# Patient Record
Sex: Female | Born: 1955 | Race: White | Hispanic: No | Marital: Married | Smoking: Former smoker
Health system: Southern US, Community
[De-identification: ages and names within clinical notes are randomized; demographics above are authoritative.]

## PROBLEM LIST (undated history)

## (undated) DIAGNOSIS — F329 Major depressive disorder, single episode, unspecified: Secondary | ICD-10-CM

## (undated) DIAGNOSIS — F419 Anxiety disorder, unspecified: Secondary | ICD-10-CM

## (undated) DIAGNOSIS — N951 Menopausal and female climacteric states: Secondary | ICD-10-CM

## (undated) DIAGNOSIS — F32A Depression, unspecified: Secondary | ICD-10-CM

## (undated) DIAGNOSIS — M1712 Unilateral primary osteoarthritis, left knee: Secondary | ICD-10-CM

## (undated) DIAGNOSIS — G4730 Sleep apnea, unspecified: Secondary | ICD-10-CM

## (undated) DIAGNOSIS — N393 Stress incontinence (female) (male): Secondary | ICD-10-CM

## (undated) HISTORY — PX: MENISCUS REPAIR: SHX5179

## (undated) HISTORY — PX: APPENDECTOMY: SHX54

## (undated) HISTORY — PX: CARPAL TUNNEL RELEASE: SHX101

## (undated) SURGERY — Surgical Case
Anesthesia: *Unknown

---

## 1986-06-23 HISTORY — PX: DIAGNOSTIC LAPAROSCOPY: SUR761

## 1998-03-20 ENCOUNTER — Other Ambulatory Visit: Admission: RE | Admit: 1998-03-20 | Discharge: 1998-03-20 | Payer: Self-pay | Admitting: Obstetrics and Gynecology

## 1999-03-21 ENCOUNTER — Other Ambulatory Visit: Admission: RE | Admit: 1999-03-21 | Discharge: 1999-03-21 | Payer: Self-pay | Admitting: Obstetrics and Gynecology

## 1999-11-08 ENCOUNTER — Encounter (INDEPENDENT_AMBULATORY_CARE_PROVIDER_SITE_OTHER): Payer: Self-pay | Admitting: Specialist

## 1999-11-08 ENCOUNTER — Ambulatory Visit (HOSPITAL_BASED_OUTPATIENT_CLINIC_OR_DEPARTMENT_OTHER): Admission: RE | Admit: 1999-11-08 | Discharge: 1999-11-08 | Payer: Self-pay | Admitting: General Surgery

## 1999-11-08 HISTORY — PX: OTHER SURGICAL HISTORY: SHX169

## 2000-03-30 ENCOUNTER — Other Ambulatory Visit: Admission: RE | Admit: 2000-03-30 | Discharge: 2000-03-30 | Payer: Self-pay | Admitting: Obstetrics and Gynecology

## 2001-04-28 ENCOUNTER — Other Ambulatory Visit: Admission: RE | Admit: 2001-04-28 | Discharge: 2001-04-28 | Payer: Self-pay | Admitting: Obstetrics and Gynecology

## 2001-08-20 ENCOUNTER — Ambulatory Visit (HOSPITAL_COMMUNITY): Admission: RE | Admit: 2001-08-20 | Discharge: 2001-08-20 | Payer: Self-pay | Admitting: Gastroenterology

## 2002-10-18 ENCOUNTER — Other Ambulatory Visit: Admission: RE | Admit: 2002-10-18 | Discharge: 2002-10-18 | Payer: Self-pay | Admitting: Obstetrics and Gynecology

## 2003-11-15 ENCOUNTER — Other Ambulatory Visit: Admission: RE | Admit: 2003-11-15 | Discharge: 2003-11-15 | Payer: Self-pay | Admitting: Obstetrics and Gynecology

## 2003-11-22 ENCOUNTER — Encounter: Admission: RE | Admit: 2003-11-22 | Discharge: 2003-11-22 | Payer: Self-pay | Admitting: Obstetrics and Gynecology

## 2004-12-26 ENCOUNTER — Other Ambulatory Visit: Admission: RE | Admit: 2004-12-26 | Discharge: 2004-12-26 | Payer: Self-pay | Admitting: Obstetrics and Gynecology

## 2010-01-15 ENCOUNTER — Ambulatory Visit (HOSPITAL_BASED_OUTPATIENT_CLINIC_OR_DEPARTMENT_OTHER): Admission: RE | Admit: 2010-01-15 | Discharge: 2010-01-15 | Payer: Self-pay | Admitting: Orthopedic Surgery

## 2010-05-31 ENCOUNTER — Ambulatory Visit
Admission: RE | Admit: 2010-05-31 | Discharge: 2010-05-31 | Payer: Self-pay | Source: Home / Self Care | Attending: Orthopedic Surgery | Admitting: Orthopedic Surgery

## 2010-11-08 NOTE — Op Note (Signed)
[facility]  Patient:    Joanna Vazquez, Joanna Vazquez                      MRN: [id number] Proc. Date: 11/08/99 Adm. Date:  [id number] Disc. Date: [id number] Attending:  Glenna Fellows Tappan Dictator:   Lorne Skeens. Hoxworth, M.D.                           Operative Report  PREOPERATIVE DIAGNOSES: 1. Dysplastic nevus of the right mid back. 2. 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  POSTOPERATIVE DIAGNOSES: 1. Dysplastic nevus of the right mid back. 2. 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  PROCEDURES: 1. Excision of dysplastic nevus of the back. 2. Excision of atypical melanocytic neoplasm, right lower leg.  SURGEON:  Dr. Johna Sheriff.  ANESTHESIA:  Local with intravenous sedation.  HISTORY OF PRESENT ILLNESS:  Ms. Frentz is a 55 year old white female who recently underwent biopsy of two pigmented lesions, one on the right mid back and one on the right lower extremity.  Pathology on the back lesion has shown a dysplastic nevus with margins involved.  The lesion on the lower extremity has revealed an atypical melanocytic neoplasm with early stage in situ malignant melanoma not able to be ruled out.  Re-excision of both areas has been recommended with a microscopic negative margin on the back lesion, and a 4 mm margin on the lesion on the lower extremity.  The nature of the procedure has been discussed, including risks of bleeding and infection.  She is now brought to the operating room for these procedures.  DESCRIPTION OF PROCEDURE:  The patient is brought to the operating room, placed in the supine position on the operating table.  IV sedation was administered.  She was carefully positioned in the lateral position and the back was sterilely prepped and draped.  I did a transverse elliptical excision of the scar on the back with grossly normal margins with full thickness of skin.  Hemostasis was obtained with  cautery, and the incision was closed with running mattress suture of 4-0 Nylon.  Following this, the lateral right lower extremity was sterilely prepped and draped and the skin and soft tissue anesthetized.  A longitudinal elliptical excision was marked with 4 mm margins from the edge of the scar.  A full thickness elliptical skin was sharply excised.  A short skin and subcutaneous flaps were raised to allow closure under less tension.  Hemostasis was obtained with the cautery.  The wound was then closed with running mattress suture of 4-0 Nylon.  Sponge and needle counts were correct.  Dry sterile dressings were applied, and the patient was taken to recovery in good condition. DD:  11/08/99 TD:  11/13/99 Job: [id number]

## 2010-11-08 NOTE — Procedures (Signed)
[facility]  Patient:    Joanna Vazquez, Joanna Vazquez Visit Number: [id number] MRN: [id number]          Service Type: END Location: ENDO Attending Physician:  Louie Bun Dictated by:   Everardo All Madilyn Fireman, M.D. Proc. Date: 08/20/01 Admit Date:  08/20/2001   CC:         Juluis Mire, M.D.   Procedure Report  PROCEDURE:  Colonoscopy.  SURGEON:  John C. Madilyn Fireman, M.D.  INDICATIONS FOR PROCEDURE:  Family history of colon cancer in a first degree relative.  DESCRIPTION OF PROCEDURE:  The patient was placed in the left lateral decubitus position and placed on the pulse monitor with continuous low flow oxygen delivered by nasal cannula.  She was sedated with 70 mg of IV Demerol and 7 mg of IV Versed.  The Olympus video colonoscope was inserted into the rectum and advanced to the cecum, confirmed by transillumination at McBurneys point, and visualization of the ileocecal valve and appendiceal orifice.  The prep was excellent.  The cecum, ascending, transverse, descending, and sigmoid colon all appeared normal with no masses, polyps, diverticula, or other mucosal abnormalities.  The rectum likewise appeared normal on retroflexed view of the anus and revealed no obvious internal hemorrhoids.  The colonoscope was then withdrawn and the patient returned to the recovery room in stable condition.  She tolerated the procedure well and there were no immediate complications.  IMPRESSION:  Essentially normal colonoscopy.  PLAN:  Repeat study in five years based on her family history. Dictated by:   Everardo All Madilyn Fireman, M.D. Attending Physician:  Louie Bun DD:  08/20/01 TD:  08/20/01 Job: [id number]

## 2011-11-04 ENCOUNTER — Other Ambulatory Visit: Payer: Self-pay | Admitting: Obstetrics and Gynecology

## 2011-11-04 DIAGNOSIS — R928 Other abnormal and inconclusive findings on diagnostic imaging of breast: Secondary | ICD-10-CM

## 2011-11-11 ENCOUNTER — Ambulatory Visit
Admission: RE | Admit: 2011-11-11 | Discharge: 2011-11-11 | Disposition: A | Discharge status: Home / Self Care | Payer: BC Managed Care – PPO | Source: Ambulatory Visit | Attending: Obstetrics and Gynecology | Admitting: Obstetrics and Gynecology

## 2011-11-11 DIAGNOSIS — R928 Other abnormal and inconclusive findings on diagnostic imaging of breast: Secondary | ICD-10-CM

## 2013-01-07 ENCOUNTER — Encounter (HOSPITAL_BASED_OUTPATIENT_CLINIC_OR_DEPARTMENT_OTHER): Payer: Self-pay | Admitting: *Deleted

## 2013-01-11 ENCOUNTER — Encounter (HOSPITAL_BASED_OUTPATIENT_CLINIC_OR_DEPARTMENT_OTHER): Payer: Self-pay | Admitting: *Deleted

## 2013-01-11 NOTE — Progress Notes (Signed)
NPO AFTER MN. ARRIVES AT 0600. PRE-OP ORDERS PENDING. NEEDS HG.

## 2013-01-13 NOTE — H&P (Signed)
  Patient name  Joanna Vazquez, Joanna Vazquez DICTATION# [id number] CSN# [id number]  Juluis Mire, MD 01/13/2013 4:24 AM

## 2013-01-14 NOTE — H&P (Signed)
NAME:  Joanna Vazquez, Joanna Vazquez                  ACCOUNT NO.:  MEDICAL RECORD NO.:  LOCATION:                                 FACILITY:  PHYSICIAN:  Juluis Mire, M.D.        DATE OF BIRTH:  DATE OF ADMISSION: DATE OF DISCHARGE:                             HISTORY & PHYSICAL   Date of her surgery is January 17, 2013.  She is being done at [facility] outpatient area at [facility].  The patient is a 57 year old gravida 1, para 1 female who presents for management of stress incontinence with the mid urethral sling using a transobturator approach and cystoscopy.  In relation to present admission, the patient has been having trouble with worsening stress urinary incontinence.  She leaks when she coughs, sneezes, or does physical activity.  We did urodynamic testing in the office.  She had a normal postvoid residual.  She had normal leak point pressures.  Normal urethral pressure profile.  No evidence of uninhibited bladder contractions.  In view of this, she now presents for a mid urethral sling.  Other alternatives, such as physical therapy have been discussed.  ALLERGIES:  No known drug allergies.  MEDICATIONS:  Lexapro 20 mg a day.  PAST MEDICAL HISTORY:  Usual childhood diseases.  No significant sequelae.  PAST SURGICAL HISTORY:  She had a previous diagnostic laparoscopy, hysteroscopy, and laser ablation of the cervix.  This was done with the finding of endometriosis in for management of cervical dysplasia.  She has had one vaginal delivery.  FAMILY HISTORY:  There is a history of colon cancer, hypertension, and diabetes.  SOCIAL HISTORY:  Reveals occasional alcohol.  No tobacco use.  REVIEW OF SYSTEMS:  Noncontributory.  PHYSICAL EXAMINATION:  VITAL SIGNS:  The patient is afebrile, stable vital signs. HEENT:  The patient is normocephalic.  Pupils equal, round, and reactive to light and accommodation.  Extraocular movements were intact.  Sclerae and conjunctivae  are clear.  Oropharynx clear. NECK:  Without thyromegaly. BREASTS:  No discrete masses. LUNGS:  Clear. CARDIOVASCULAR SYSTEM:  Regular rhythm and rate.  No murmurs or gallops. ABDOMEN:  Benign.  No mass, organomegaly, or tenderness.  Pelvic is normal external genitalia, vaginal mucosa, mild cystourethrocele, cervix unremarkable.  Uterus normal size, shape, and contour.  Adnexa free of masses or tenderness. EXTREMITIES:  Trace edema. NEUROLOGIC:  Grossly within normal limits.  IMPRESSION:  Anatomical stress urinary incontinence for mid urethral sling.  PLAN:  Again, alternatives, such as physical therapy have been discussed.  Declined by patient.  She wishes to proceed with surgical management.  Success rates of 85% have been quoted.  The risks have been explained including risk of infection.  Risk of vascular injury that could lead to hemorrhage, requiring transfusion with risk of AIDS or hepatitis.  Risk of injury to adjacent organs such as bladder, urethral or ureters that could require further surgery.  Risk of deep venous thrombosis and pulmonary embolus.  Discussed the possibility of over tightening requiring loosening of the sling with possible return of incontinence.  We also discussed the development of bladder spasms, which require medical therapy.  Rate risk of obturator  nerve injury has been discussed, which could lead to chronic leg pain and weakness. Finally, there was a risk of mesh erosion or rejection that could lead to dyspareunia and possible need to remove the mesh.  The patient does understand potential risks, and alternatives.     Juluis Mire, M.D.     JSM/MEDQ  D:  01/13/2013  T:  01/13/2013  Job:  [id number]

## 2013-01-17 ENCOUNTER — Encounter (HOSPITAL_BASED_OUTPATIENT_CLINIC_OR_DEPARTMENT_OTHER): Payer: Self-pay | Admitting: *Deleted

## 2013-01-17 ENCOUNTER — Ambulatory Visit (HOSPITAL_COMMUNITY): Payer: BC Managed Care – PPO

## 2013-01-17 ENCOUNTER — Encounter (HOSPITAL_BASED_OUTPATIENT_CLINIC_OR_DEPARTMENT_OTHER): Payer: Self-pay | Admitting: Anesthesiology

## 2013-01-17 ENCOUNTER — Ambulatory Visit (HOSPITAL_BASED_OUTPATIENT_CLINIC_OR_DEPARTMENT_OTHER): Payer: BC Managed Care – PPO | Admitting: Anesthesiology

## 2013-01-17 ENCOUNTER — Encounter (HOSPITAL_BASED_OUTPATIENT_CLINIC_OR_DEPARTMENT_OTHER)
Admission: RE | Disposition: A | Discharge status: Home / Self Care | Payer: Self-pay | Source: Ambulatory Visit | Attending: Obstetrics and Gynecology

## 2013-01-17 ENCOUNTER — Ambulatory Visit (HOSPITAL_BASED_OUTPATIENT_CLINIC_OR_DEPARTMENT_OTHER)
Admission: RE | Admit: 2013-01-17 | Discharge: 2013-01-17 | Disposition: A | Discharge status: Home / Self Care | Payer: BC Managed Care – PPO | Source: Ambulatory Visit | Attending: Obstetrics and Gynecology | Admitting: Obstetrics and Gynecology

## 2013-01-17 DIAGNOSIS — T83712A Erosion of implanted urethral mesh to surrounding organ or tissue, initial encounter: Secondary | ICD-10-CM

## 2013-01-17 DIAGNOSIS — N393 Stress incontinence (female) (male): Secondary | ICD-10-CM | POA: Insufficient documentation

## 2013-01-17 HISTORY — PX: CYSTOSCOPY: SHX5120

## 2013-01-17 HISTORY — DX: Menopausal and female climacteric states: N95.1

## 2013-01-17 HISTORY — DX: Stress incontinence (female) (male): N39.3

## 2013-01-17 HISTORY — PX: PUBOVAGINAL SLING: SHX1035

## 2013-01-17 LAB — CBC
MCH: 29 pg (ref 26.0–34.0)
MCV: 87.2 fL (ref 78.0–100.0)
Platelets: 200 10*3/uL (ref 150–400)
RBC: 4.31 MIL/uL (ref 3.87–5.11)
RDW: 13 % (ref 11.5–15.5)
WBC: 7.9 10*3/uL (ref 4.0–10.5)

## 2013-01-17 SURGERY — CREATION, PUBOVAGINAL SLING
Anesthesia: General | Site: Vagina | Wound class: Clean Contaminated

## 2013-01-17 MED ORDER — INDIGOTINDISULFONATE SODIUM 8 MG/ML IJ SOLN
INTRAMUSCULAR | Status: DC | PRN
  Administered 2013-01-17: 5 mL via INTRAVENOUS

## 2013-01-17 MED ORDER — LACTATED RINGERS IV SOLN
INTRAVENOUS | Status: DC
  Administered 2013-01-17 (×2): via INTRAVENOUS
  Filled 2013-01-17: qty 1000

## 2013-01-17 MED ORDER — PROPOFOL 10 MG/ML IV BOLUS
INTRAVENOUS | Status: DC | PRN
  Administered 2013-01-17: 200 mg via INTRAVENOUS

## 2013-01-17 MED ORDER — SODIUM CHLORIDE 0.9 % IR SOLN
Status: DC | PRN
  Administered 2013-01-17: 08:00:00

## 2013-01-17 MED ORDER — ALBUTEROL SULFATE (5 MG/ML) 0.5% IN NEBU
2.5000 mg | INHALATION_SOLUTION | Freq: Four times a day (QID) | RESPIRATORY_TRACT | Status: DC | PRN
  Administered 2013-01-17 (×2): 2.5 mg via RESPIRATORY_TRACT
  Filled 2013-01-17: qty 0.5

## 2013-01-17 MED ORDER — PROMETHAZINE HCL 25 MG/ML IJ SOLN
6.2500 mg | INTRAMUSCULAR | Status: DC | PRN
  Filled 2013-01-17: qty 1

## 2013-01-17 MED ORDER — BUPIVACAINE-EPINEPHRINE PF 0.5-1:200000 % IJ SOLN
INTRAMUSCULAR | Status: DC | PRN
  Administered 2013-01-17: 18 mL

## 2013-01-17 MED ORDER — STERILE WATER FOR IRRIGATION IR SOLN
Status: DC | PRN
  Administered 2013-01-17: 1000 mL

## 2013-01-17 MED ORDER — ACETAMINOPHEN 10 MG/ML IV SOLN
INTRAVENOUS | Status: DC | PRN
  Administered 2013-01-17: 1000 mg via INTRAVENOUS

## 2013-01-17 MED ORDER — DEXAMETHASONE SODIUM PHOSPHATE 4 MG/ML IJ SOLN
INTRAMUSCULAR | Status: DC | PRN
  Administered 2013-01-17: 10 mg via INTRAVENOUS

## 2013-01-17 MED ORDER — LACTATED RINGERS IV SOLN
INTRAVENOUS | Status: DC
  Filled 2013-01-17: qty 1000

## 2013-01-17 MED ORDER — KETOROLAC TROMETHAMINE 30 MG/ML IJ SOLN
INTRAMUSCULAR | Status: DC | PRN
  Administered 2013-01-17: 30 mg via INTRAVENOUS

## 2013-01-17 MED ORDER — MIDAZOLAM HCL 5 MG/5ML IJ SOLN
INTRAMUSCULAR | Status: DC | PRN
  Administered 2013-01-17: 2 mg via INTRAVENOUS

## 2013-01-17 MED ORDER — LIDOCAINE HCL (CARDIAC) 20 MG/ML IV SOLN
INTRAVENOUS | Status: DC | PRN
  Administered 2013-01-17: 80 mg via INTRAVENOUS

## 2013-01-17 MED ORDER — FENTANYL CITRATE 0.05 MG/ML IJ SOLN
INTRAMUSCULAR | Status: DC | PRN
  Administered 2013-01-17: 25 ug via INTRAVENOUS
  Administered 2013-01-17 (×3): 50 ug via INTRAVENOUS
  Administered 2013-01-17: 25 ug via INTRAVENOUS

## 2013-01-17 MED ORDER — ONDANSETRON HCL 4 MG/2ML IJ SOLN
INTRAMUSCULAR | Status: DC | PRN
  Administered 2013-01-17: 4 mg via INTRAVENOUS

## 2013-01-17 MED ORDER — CEFAZOLIN SODIUM-DEXTROSE 2-3 GM-% IV SOLR
2.0000 g | INTRAVENOUS | Status: AC
  Administered 2013-01-17: 2 g via INTRAVENOUS
  Filled 2013-01-17: qty 50

## 2013-01-17 SURGICAL SUPPLY — 46 items
ADH SKN CLS APL DERMABOND .7 (GAUZE/BANDAGES/DRESSINGS)
BAG URINE DRAINAGE (UROLOGICAL SUPPLIES) ×3 IMPLANT
BLADE SURG 15 STRL LF DISP TIS (BLADE) ×2 IMPLANT
BLADE SURG 15 STRL SS (BLADE) ×3
CANISTER SUCTION 2500CC (MISCELLANEOUS) ×3 IMPLANT
CATH FOLEY 2WAY SLVR  5CC 14FR (CATHETERS) ×1
CATH FOLEY 2WAY SLVR  5CC 16FR (CATHETERS) ×1
CATH FOLEY 2WAY SLVR 5CC 14FR (CATHETERS) ×2 IMPLANT
CATH FOLEY 2WAY SLVR 5CC 16FR (CATHETERS) IMPLANT
CATH ROBINSON RED A/P 16FR (CATHETERS) ×1 IMPLANT
CLOTH BEACON ORANGE TIMEOUT ST (SAFETY) ×3 IMPLANT
DERMABOND ADVANCED (GAUZE/BANDAGES/DRESSINGS)
DERMABOND ADVANCED .7 DNX12 (GAUZE/BANDAGES/DRESSINGS) ×2 IMPLANT
DRAPE CAMERA CLOSED 9X96 (DRAPES) ×1 IMPLANT
DRAPE HYSTEROSCOPY (DRAPE) ×3 IMPLANT
DRAPE LG THREE QUARTER DISP (DRAPES) ×1 IMPLANT
ELECT REM PT RETURN 9FT ADLT (ELECTROSURGICAL) ×3
ELECTRODE REM PT RTRN 9FT ADLT (ELECTROSURGICAL) ×2 IMPLANT
GLOVE BIO SURGEON STRL SZ 6.5 (GLOVE) ×3 IMPLANT
GLOVE BIO SURGEON STRL SZ7 (GLOVE) ×4 IMPLANT
GLOVE INDICATOR 6.5 STRL GRN (GLOVE) ×1 IMPLANT
GOWN PREVENTION PLUS LG XLONG (DISPOSABLE) ×3 IMPLANT
GOWN STRL REIN XL XLG (GOWN DISPOSABLE) ×3 IMPLANT
HOLDER FOLEY CATH W/STRAP (MISCELLANEOUS) ×3 IMPLANT
NDL SPNL 22GX3.5 QUINCKE BK (NEEDLE) ×2 IMPLANT
NEEDLE HYPO 22GX1.5 SAFETY (NEEDLE) ×1 IMPLANT
NEEDLE SPNL 22GX3.5 QUINCKE BK (NEEDLE) IMPLANT
NS IRRIG 1000ML POUR BTL (IV SOLUTION) ×2 IMPLANT
NS IRRIG 500ML POUR BTL (IV SOLUTION) IMPLANT
PACK BASIN DAY SURGERY FS (CUSTOM PROCEDURE TRAY) ×3 IMPLANT
PACK CYSTOSCOPY (CUSTOM PROCEDURE TRAY) ×3 IMPLANT
PACKING VAGINAL (PACKING) ×1 IMPLANT
PENCIL BUTTON HOLSTER BLD 10FT (ELECTRODE) ×3 IMPLANT
PLUG CATH AND CAP STER (CATHETERS) ×3 IMPLANT
SET CYSTO W/LG BORE CLAMP LF (SET/KITS/TRAYS/PACK) ×3 IMPLANT
SET IRRIG Y TYPE TUR BLADDER L (SET/KITS/TRAYS/PACK) ×3 IMPLANT
SLING HALO OBTRYX (Sling) ×3 IMPLANT
SLING HALO OBTRYX NDL (Sling) IMPLANT
SUT VIC AB 2-0 UR6 27 (SUTURE) ×3 IMPLANT
SYR BULB IRRIGATION 50ML (SYRINGE) ×3 IMPLANT
SYRINGE 10CC LL (SYRINGE) ×3 IMPLANT
TOWEL OR 17X24 6PK STRL BLUE (TOWEL DISPOSABLE) ×6 IMPLANT
TRAY DSU PREP LF (CUSTOM PROCEDURE TRAY) ×3 IMPLANT
TUBE CONNECTING 12X1/4 (SUCTIONS) ×4 IMPLANT
WATER STERILE IRR 3000ML UROMA (IV SOLUTION) ×3 IMPLANT
YANKAUER SUCT BULB TIP NO VENT (SUCTIONS) ×3 IMPLANT

## 2013-01-17 NOTE — Brief Op Note (Signed)
01/17/2013  8:29 AM  PATIENT:  Joanna Vazquez  57 y.o. female  PRE-OPERATIVE DIAGNOSIS:  SUI cpt [phone]  POST-OPERATIVE DIAGNOSIS:  Stress Urinary Incontinence  PROCEDURE:  Procedure(s) with comments: PUBO-VAGINAL SLING (N/A) - transobturator sling with cysto CYSTOSCOPY (N/A)  SURGEON:  Surgeon(s) and Role:    * Juluis Mire, MD - Primary  PHYSICIAN ASSISTANT:   ASSISTANTS: none   ANESTHESIA:   local and general  EBL:  Total I/O In: 1000 [I.V.:1000] Out: -   BLOOD ADMINISTERED:none  DRAINS: Urinary Catheter (Foley)   LOCAL MEDICATIONS USED:  MARCAINE     SPECIMEN:  No Specimen  DISPOSITION OF SPECIMEN:  N/A  COUNTS:  YES  TOURNIQUET:  * No tourniquets in log *  DICTATION: .Other Dictation: Dictation Number [id number]  PLAN OF CARE: Discharge to home after PACU  PATIENT DISPOSITION:  PACU - hemodynamically stable.   Delay start of Pharmacological VTE agent (>24hrs) due to surgical blood loss or risk of bleeding: not applicable

## 2013-01-17 NOTE — Progress Notes (Signed)
Patient does not need urine preg

## 2013-01-17 NOTE — Op Note (Signed)
NAME:  Joanna Vazquez, Joanna Vazquez NO.:  [id number]  MEDICAL RECORD NO.:  [id number]  LOCATION:                                 FACILITY:  PHYSICIAN:  Juluis Mire, M.D.        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  PROCEDURE:  Mid-urethral sling using a transobturator approach. Cystoscopy.  SURGEON:  Juluis Mire, MD  ANESTHESIA:  General.  BLOOD LOSS:  100 mL.  PACKS:  None.  DRAINS:  Included urethral Foley.  INTRAOPERATIVE BLOOD PLACED:  None.  COMPLICATIONS:  None.  INDICATION:  Dictated in history and physical.  PROCEDURE IN DETAIL:  As follows; the patient was taken to the OR and placed in supine position.  After satisfactory level of anesthesia was obtained, the patient was placed in dorsal lithotomy position using the Allen stirrups.  Perineum and vagina were prepped out with Betadine, draped in sterile field.  A Foley was placed and clamped off and mid urethral area was identified, was then infiltrated with 0.5% Marcaine with epinephrine.  We also infiltrated out laterally to the obturator foramen.  Using a knife, an incision was made in the vaginal mucosa in the mid urethral area using blunt and sharp dissection.  We were able to dissect out laterally to both obturator foramen.  Point on the groin was identified at the level of clitoris lateral to the inferior pubic ramus, and below the abductus longus muscles.  This area was infiltrated and a punch incision was made.  The obturator system was brought in place. The needles were passed through the skin around the obturator through the obturator foramen around the inferior pubic ramus and out to the vaginal incision on both sides.   There was no evidence that we had buttonholed the vaginal mucosa.  Cystoscopy was then performed.  There was no evidence of injury to the bladder or urethra.  Both ureteral orifices were identified and they were spilling blue-tinged urine.  At this point in time, the cystoscope was removed.  Foley was placed back. It was again clamped off.  The polypropylene mesh was put in place, hooked to both needles and brought out through the skin.  The tab was cut.  It was adjusted in mid urethral area until lay flat, but we were relatively lucent, we could easily rotate a Kelly 90 degrees.  At this point in time, the plastic sheaths were removed.  Again, the mesh was adjusted so that it was at a proper tension.  The arms of the mesh coming out of the skin were cut.  Skin with Dermabond.  The vaginal mucosa closed in a running locked suture of 2-0 Vicryl.  Foley was placed to straight drain.  The patient taken out the dorsal lithotomy position.  Once alert and extubated, transferred to recovery room in good condition.  Sponge, instrument, and needle count was correct by circulating nurse x2.     Juluis Mire, M.D.  JSM/MEDQ  D:  01/17/2013  T:  01/17/2013  Job:  [id number]

## 2013-01-17 NOTE — Op Note (Signed)
Patient name Joanna Vazquez, Joanna Vazquez DICTATION#  [id number] CSN# [id number]   Juluis Mire, MD 01/17/2013 8:30 AM

## 2013-01-17 NOTE — Progress Notes (Signed)
Dr. Arelia Sneddon called and reported voiding 400 ml pink urine.

## 2013-01-17 NOTE — Transfer of Care (Signed)
Immediate Anesthesia Transfer of Care Note  Patient: Joanna Vazquez  Procedure(s) Performed: Procedure(s) (LRB): PUBO-VAGINAL SLING (N/A) CYSTOSCOPY (N/A)  Patient Location: PACU  Anesthesia Type: General  Level of Consciousness: drowsy, follows commands.  Airway & Oxygen Therapy: Patient Spontanous Breathing and Patient connected to face mask oxygen  Post-op Assessment: Report given to PACU RN and Post -op Vital signs reviewed and Oxygen Saturation in high 80's to low 90's. Crackles heard in bilateral lungs. Dr. Rica Mast aware. Breathing treatment and Chest x-ray ordered. Pt. Sitting up, coughing and deep breathing. Other VSS. Oxygen saturation up to 100% with breathing treatment.   Post vital signs: Reviewed and stable  Complications: No apparent anesthesia complications

## 2013-01-17 NOTE — Anesthesia Procedure Notes (Signed)
Procedure Name: LMA Insertion Date/Time: 01/17/2013 7:35 AM Performed by: Norva Pavlov Pre-anesthesia Checklist: Patient identified, Emergency Drugs available, Suction available and Patient being monitored Patient Re-evaluated:Patient Re-evaluated prior to inductionOxygen Delivery Method: Circle System Utilized Preoxygenation: Pre-oxygenation with 100% oxygen Intubation Type: IV induction Ventilation: Mask ventilation without difficulty LMA: LMA inserted LMA Size: 4.0 Number of attempts: 1 Airway Equipment and Method: bite block Placement Confirmation: positive ETCO2 Tube secured with: Tape Dental Injury: Teeth and Oropharynx as per pre-operative assessment

## 2013-01-17 NOTE — H&P (Signed)
  History and physical exam unchanged 

## 2013-01-17 NOTE — Anesthesia Preprocedure Evaluation (Signed)
Anesthesia Evaluation  Patient identified by MRN, date of birth, ID band Patient awake    Reviewed: Allergy & Precautions, H&P , NPO status , Patient's Chart, lab work & pertinent test results  Airway Mallampati: II TM Distance: >3 FB Neck ROM: Full    Dental  (+) Teeth Intact and Dental Advisory Given   Pulmonary neg pulmonary ROS,    Pulmonary exam normal       Cardiovascular negative cardio ROS  Rhythm:Regular Rate:Normal     Neuro/Psych negative neurological ROS  negative psych ROS   GI/Hepatic negative GI ROS, Neg liver ROS,   Endo/Other  negative endocrine ROS  Renal/GU negative Renal ROS  negative genitourinary   Musculoskeletal negative musculoskeletal ROS (+)   Abdominal   Peds negative pediatric ROS (+)  Hematology negative hematology ROS (+)   Anesthesia Other Findings   Reproductive/Obstetrics negative OB ROS Patient denies risk of pregnancy.                           Anesthesia Physical Anesthesia Plan  ASA: I  Anesthesia Plan: General   Post-op Pain Management:    Induction: Intravenous  Airway Management Planned: LMA  Additional Equipment:   Intra-op Plan:   Post-operative Plan: Extubation in OR  Informed Consent: I have reviewed the patients History and Physical, chart, labs and discussed the procedure including the risks, benefits and alternatives for the proposed anesthesia with the patient or authorized representative who has indicated his/her understanding and acceptance.   Dental advisory given  Plan Discussed with: CRNA  Anesthesia Plan Comments:         Anesthesia Quick Evaluation

## 2013-01-17 NOTE — Anesthesia Postprocedure Evaluation (Signed)
  Anesthesia Post-op Note  Patient: Joanna Vazquez  Procedure(s) Performed: Procedure(s) with comments: PUBO-VAGINAL SLING (N/A) - transobturator sling with cysto CYSTOSCOPY (N/A)  Patient Location: PACU  Anesthesia Type:General  Level of Consciousness: awake, alert  and oriented  Airway and Oxygen Therapy: Patient Spontanous Breathing and Patient connected to nasal cannula oxygen  Post-op Pain: mild  Post-op Assessment: Post-op Vital signs reviewed  Post-op Vital Signs: stable  Complications: respiratory complications

## 2013-01-18 ENCOUNTER — Encounter (HOSPITAL_BASED_OUTPATIENT_CLINIC_OR_DEPARTMENT_OTHER): Payer: Self-pay | Admitting: Obstetrics and Gynecology

## 2013-01-28 ENCOUNTER — Ambulatory Visit (INDEPENDENT_AMBULATORY_CARE_PROVIDER_SITE_OTHER): Payer: BC Managed Care – PPO | Admitting: General Surgery

## 2013-01-28 ENCOUNTER — Encounter (INDEPENDENT_AMBULATORY_CARE_PROVIDER_SITE_OTHER): Payer: Self-pay | Admitting: General Surgery

## 2013-01-28 VITALS — BP 110/62 | HR 74 | Temp 98.2°F | Resp 18 | Ht 65.0 in | Wt 212.0 lb

## 2013-01-28 DIAGNOSIS — K644 Residual hemorrhoidal skin tags: Secondary | ICD-10-CM

## 2013-01-28 NOTE — Progress Notes (Signed)
Patient ID: Joanna Vazquez, female   DOB: Aug 22, 1955, 57 y.o.   MRN: [id number]  Chief Complaint  Patient presents with  . Other    Eval painful bleeding hems    HPI Joanna Vazquez is a 57 y.o. female.  The patient is a 57 year old female who is referred by Dr. Arelia Sneddon  For an evaluation ofexternal hemorrhoids. Patient issues that would be for approximately 22 years from her last birth.  The patient she has on-off painful bleeding as well as pain with bleeding. She states she's saturating several of her clothing items unknowingly.  Of note the patient has a family history of colon cancer as been undergoing screening colonoscopy since age 52. They have all been normal. HPI  Past Medical History  Diagnosis Date  . SUI (stress urinary incontinence, female)   . Perimenopause     Past Surgical History  Procedure Laterality Date  . Carpal tunnel release Bilateral LEFT  01-15-2010/   RIGHT 05-31-2010  . Excision nevus right mid back/ excision melanocystic neoplasm right lower leg  11-08-1999  . Diagnostic laparoscopy  1988  . Pubovaginal sling N/A 01/17/2013    Procedure: Leonides Grills;  Surgeon: Juluis Mire, MD;  Location: [facility];  Service: Gynecology;  Laterality: N/A;  transobturator sling with cysto  . Cystoscopy N/A 01/17/2013    Procedure: CYSTOSCOPY;  Surgeon: Juluis Mire, MD;  Location: [facility];  Service: Gynecology;  Laterality: N/A;    History reviewed. No pertinent family history.  Social History History  Substance Use Topics  . Smoking status: Former Games developer  . Smokeless tobacco: Former Neurosurgeon    Quit date: 01/29/1991  . Alcohol Use: 1.0 oz/week    2 drink(s) per week    No Known Allergies  Current Outpatient Prescriptions  Medication Sig Dispense Refill  . escitalopram (LEXAPRO) 20 MG tablet Take 20 mg by mouth daily.      . fexofenadine (ALLEGRA) 180 MG tablet Take 180 mg by mouth daily.       No current  facility-administered medications for this visit.    Review of Systems Review of Systems  Constitutional: Negative.   HENT: Negative.   Respiratory: Negative.   Cardiovascular: Negative.   Gastrointestinal: Positive for anal bleeding.  Endocrine: Negative.   Musculoskeletal: Negative.   Neurological: Negative.   All other systems reviewed and are negative.    Blood pressure 110/62, pulse 74, temperature 98.2 F (36.8 C), resp. rate 18, height 5\' 5"  (1.651 m), weight 212 lb (96.163 kg), last menstrual period 12/12/2012.  Physical Exam Physical Exam  Constitutional: She is oriented to person, place, and time. She appears well-developed and well-nourished.  HENT:  Head: Normocephalic and atraumatic.  Eyes: Conjunctivae and EOM are normal. Pupils are equal, round, and reactive to light.  Neck: Normal range of motion. Neck supple.  Cardiovascular: Normal rate, regular rhythm and normal heart sounds.   Pulmonary/Chest: Effort normal and breath sounds normal.  Abdominal: Soft.  Genitourinary: Rectal exam shows external hemorrhoid and internal hemorrhoid.  Neurological: She is alert and oriented to person, place, and time.  Skin: Skin is dry.    Data Reviewed none  Assessment    57 year old female with symptomatic external/internal hemorrhoids.     Plan    1. We'll proceed to the operating room for exam under anesthesia as well as excision of external and possible banding of internal hemorrhoids. 2. I discussed with the patient the risks and benefits of surgery  to include infection, bleeding, healing complications, recurrence of hemorrhoids, and damage to surrounding tissue. The patient voiced understanding and wishes to proceed.        Marigene Ehlers., Londynn Sonoda 01/28/2013, 10:38 AM

## 2013-03-07 ENCOUNTER — Other Ambulatory Visit (INDEPENDENT_AMBULATORY_CARE_PROVIDER_SITE_OTHER): Payer: Self-pay | Admitting: General Surgery

## 2013-03-07 ENCOUNTER — Other Ambulatory Visit (INDEPENDENT_AMBULATORY_CARE_PROVIDER_SITE_OTHER): Payer: Self-pay | Admitting: *Deleted

## 2013-03-07 DIAGNOSIS — K648 Other hemorrhoids: Secondary | ICD-10-CM

## 2013-03-07 DIAGNOSIS — K644 Residual hemorrhoidal skin tags: Secondary | ICD-10-CM

## 2013-03-07 HISTORY — PX: ANAL EXAMINATION UNDER ANESTHESIA: SHX1138

## 2013-03-07 MED ORDER — OXYCODONE-ACETAMINOPHEN 5-325 MG PO TABS: 1.0000 | ORAL_TABLET | ORAL | Status: DC | PRN

## 2013-03-10 ENCOUNTER — Telehealth (INDEPENDENT_AMBULATORY_CARE_PROVIDER_SITE_OTHER): Payer: Self-pay

## 2013-03-10 NOTE — Telephone Encounter (Signed)
Pt calling in to report that she was having issues of urinating this pm after having her first BM since hemorrhoid surgery on Monday. The pt has not had any issues with urinating until today where it felt like she needed to urinate but it was just a slow flow. I advised pt that she needed to try sitting in a warm sitz bath and urinate to see if the warm water will relax the muscles. I advised pt that if she is still having problems in the a.m. She needs to call us first thing so we can take care of this before the weekend. The pt understands.

## 2013-03-14 ENCOUNTER — Telehealth (INDEPENDENT_AMBULATORY_CARE_PROVIDER_SITE_OTHER): Payer: Self-pay

## 2013-03-14 DIAGNOSIS — R39198 Other difficulties with micturition: Secondary | ICD-10-CM

## 2013-03-14 NOTE — Telephone Encounter (Signed)
Patient called in stating she is still having problems with urinating and trouble sleeping. Wants to know if she can take Ambien with her percocet. After speaking to Dr Derrell Lolling i told her she can take Ambien with her percocet at night. He wants to get an UA on her. Advised her to go to [facility] to for the UA. We will call her with results.

## 2013-03-15 LAB — URINALYSIS
Glucose, UA: NEGATIVE mg/dL
Leukocytes, UA: NEGATIVE
Protein, ur: NEGATIVE mg/dL
Specific Gravity, Urine: 1.009 (ref 1.005–1.030)
Urobilinogen, UA: 0.2 mg/dL (ref 0.0–1.0)

## 2013-03-16 ENCOUNTER — Telehealth (INDEPENDENT_AMBULATORY_CARE_PROVIDER_SITE_OTHER): Payer: Self-pay | Admitting: *Deleted

## 2013-03-16 NOTE — Telephone Encounter (Signed)
Called and left message for patient with below message.  Instructed patient to give [place] a call if she has any questions or concerns.

## 2013-03-16 NOTE — Telephone Encounter (Signed)
Message copied by Consuelo Pandy on Wed Mar 16, 2013  9:23 AM ------      Message from: Axel Filler      Created: Tue Mar 15, 2013  3:36 PM       Can you please call pt and let her know that her UA was neg and that she does not have a UTI.            Thank you            AR      ----- Message -----         From: [facility]         Sent: 03/15/2013   1:09 AM           To: Axel Filler, MD                   ------

## 2013-03-29 ENCOUNTER — Encounter (INDEPENDENT_AMBULATORY_CARE_PROVIDER_SITE_OTHER): Payer: Self-pay | Admitting: General Surgery

## 2013-03-29 ENCOUNTER — Ambulatory Visit (INDEPENDENT_AMBULATORY_CARE_PROVIDER_SITE_OTHER): Payer: BC Managed Care – PPO | Admitting: General Surgery

## 2013-03-29 VITALS — BP 116/84 | HR 85 | Temp 96.9°F | Ht 65.0 in | Wt 210.4 lb

## 2013-03-29 DIAGNOSIS — Z9889 Other specified postprocedural states: Secondary | ICD-10-CM

## 2013-03-29 NOTE — Progress Notes (Signed)
Patient ID: Joanna Vazquez, female   DOB: [birth year], 56 y.o.   MRN: [id number] The patient is a 57 year old female status post hemorrhoidectomy on 03/07/2013. The patient is doing well at this time. Her initial complaint of incomplete emptying he has since resolved. Patient is on a bowel regimen of Colace twice a day. The patient has minimal pain with bowel movements at this time.    Assessment and plan: 21 -year-old female now status post hemorrhoidectomy 1. Patient continue with bowel regimen at this time. 2. Patient follow as needed

## 2014-03-07 ENCOUNTER — Other Ambulatory Visit: Payer: Self-pay | Admitting: Obstetrics and Gynecology

## 2014-03-08 LAB — CYTOLOGY - PAP

## 2015-03-27 ENCOUNTER — Other Ambulatory Visit: Payer: Self-pay | Admitting: Obstetrics and Gynecology

## 2015-03-28 LAB — CYTOLOGY - PAP

## 2015-03-30 ENCOUNTER — Other Ambulatory Visit: Payer: Self-pay | Admitting: Obstetrics and Gynecology

## 2015-03-30 DIAGNOSIS — R928 Other abnormal and inconclusive findings on diagnostic imaging of breast: Secondary | ICD-10-CM

## 2015-04-09 ENCOUNTER — Ambulatory Visit
Admission: RE | Admit: 2015-04-09 | Discharge: 2015-04-09 | Disposition: A | Discharge status: Home / Self Care | Payer: BLUE CROSS/BLUE SHIELD | Source: Ambulatory Visit | Attending: Obstetrics and Gynecology | Admitting: Obstetrics and Gynecology

## 2015-04-09 DIAGNOSIS — R928 Other abnormal and inconclusive findings on diagnostic imaging of breast: Secondary | ICD-10-CM

## 2015-10-12 ENCOUNTER — Other Ambulatory Visit: Payer: Self-pay | Admitting: Obstetrics and Gynecology

## 2015-10-12 DIAGNOSIS — R921 Mammographic calcification found on diagnostic imaging of breast: Secondary | ICD-10-CM

## 2015-10-18 ENCOUNTER — Other Ambulatory Visit: Payer: Self-pay | Admitting: Obstetrics and Gynecology

## 2015-10-18 ENCOUNTER — Ambulatory Visit
Admission: RE | Admit: 2015-10-18 | Discharge: 2015-10-18 | Disposition: A | Discharge status: Home / Self Care | Payer: BLUE CROSS/BLUE SHIELD | Source: Ambulatory Visit | Attending: Obstetrics and Gynecology | Admitting: Obstetrics and Gynecology

## 2015-10-18 DIAGNOSIS — R921 Mammographic calcification found on diagnostic imaging of breast: Secondary | ICD-10-CM

## 2015-10-29 ENCOUNTER — Other Ambulatory Visit: Payer: BLUE CROSS/BLUE SHIELD

## 2016-04-01 ENCOUNTER — Other Ambulatory Visit: Payer: Self-pay | Admitting: Obstetrics and Gynecology

## 2016-04-01 DIAGNOSIS — R921 Mammographic calcification found on diagnostic imaging of breast: Secondary | ICD-10-CM

## 2016-04-07 ENCOUNTER — Ambulatory Visit
Admission: RE | Admit: 2016-04-07 | Discharge: 2016-04-07 | Disposition: A | Discharge status: Home / Self Care | Payer: BLUE CROSS/BLUE SHIELD | Source: Ambulatory Visit | Attending: Obstetrics and Gynecology | Admitting: Obstetrics and Gynecology

## 2016-04-07 DIAGNOSIS — R921 Mammographic calcification found on diagnostic imaging of breast: Secondary | ICD-10-CM

## 2016-04-15 ENCOUNTER — Other Ambulatory Visit: Payer: Self-pay | Admitting: Obstetrics and Gynecology

## 2016-04-15 DIAGNOSIS — Z803 Family history of malignant neoplasm of breast: Secondary | ICD-10-CM

## 2016-04-23 ENCOUNTER — Encounter (HOSPITAL_COMMUNITY): Payer: Self-pay

## 2016-04-23 ENCOUNTER — Encounter: Payer: Self-pay | Admitting: Physician Assistant

## 2016-04-23 DIAGNOSIS — F329 Major depressive disorder, single episode, unspecified: Secondary | ICD-10-CM | POA: Diagnosis present

## 2016-04-23 DIAGNOSIS — M1712 Unilateral primary osteoarthritis, left knee: Secondary | ICD-10-CM

## 2016-04-23 DIAGNOSIS — G4730 Sleep apnea, unspecified: Secondary | ICD-10-CM | POA: Diagnosis present

## 2016-04-23 DIAGNOSIS — F419 Anxiety disorder, unspecified: Secondary | ICD-10-CM

## 2016-04-23 HISTORY — DX: Unilateral primary osteoarthritis, left knee: M17.12

## 2016-04-23 NOTE — H&P (Signed)
TOTAL KNEE ADMISSION H&P  Patient is being admitted for left total knee arthroplasty.  Subjective:  Chief Complaint:left knee pain.  HPI: Joanna Vazquez, 60 y.o. female, has a history of pain and functional disability in the left knee due to arthritis and has failed non-surgical conservative treatments for greater than 12 weeks to includeNSAID's and/or analgesics, corticosteriod injections, viscosupplementation injections, flexibility and strengthening excercises, supervised PT with diminished ADL's post treatment, weight reduction as appropriate and activity modification.  Onset of symptoms was gradual, starting 8 years ago with gradually worsening course since that time. The patient noted prior procedures on the knee to include  arthroscopy and menisectomy on the left knee(s).  Patient currently rates pain in the left knee(s) at 10 out of 10 with activity. Patient has night pain, worsening of pain with activity and weight bearing, pain that interferes with activities of daily living, crepitus and joint swelling.  Patient has evidence of subchondral sclerosis, periarticular osteophytes and joint space narrowing by imaging studies.There is no active infection.  Patient Active Problem List   Diagnosis Date Noted  . Primary localized osteoarthritis of left knee 04/23/2016  . Sleep apnea   . Anxiety and depression   . External hemorrhoids 01/28/2013  . Urinary, incontinence, stress female 01/17/2013    Class: Present on Admission  . Erosion of transobdurator mid-urethral sling (HCC) 01/17/2013   Past Medical History:  Diagnosis Date  . Anxiety and depression   . Perimenopause   . Primary localized osteoarthritis of left knee 04/23/2016  . Sleep apnea   . SUI (stress urinary incontinence, female)     Past Surgical History:  Procedure Laterality Date  . ANAL EXAMINATION UNDER ANESTHESIA  03/07/13   banding of internal and external hems  . CARPAL TUNNEL RELEASE Bilateral LEFT  01-15-2010/    RIGHT 05-31-2010  . CYSTOSCOPY N/A 01/17/2013   Procedure: CYSTOSCOPY;  Surgeon: Juluis MireJohn S McComb, MD;  [facility] [place];  Service: Gynecology;  Laterality: N/A;  . DIAGNOSTIC LAPAROSCOPY  1988  . 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 PUBOVAGINAL SLING N/A 01/17/2013   Procedure: Leonides GrillsPUBO-VAGINAL SLING;  Surgeon: Juluis MireJohn S McComb, MD;  [facility] ;  Service: Gynecology;  Laterality: N/A;  transobturator sling with cysto  No current facility-administered medications for this encounter.   Current Outpatient Prescriptions:  .  ALPRAZolam (XANAX) 0.25 MG tablet, Take 0.25 mg by mouth 3 (three) times daily as needed for anxiety., Disp: , Rfl:  .  meloxicam (MOBIC) 15 MG tablet, Take 15 mg by mouth daily., Disp: , Rfl:  .  escitalopram (LEXAPRO) 20 MG tablet, Take 20 mg by mouth daily., Disp: , Rfl:  .  fexofenadine (ALLEGRA) 180 MG tablet, Take 180 mg by mouth daily., Disp: , Rfl:  .  zolpidem (AMBIEN) 5 MG tablet, Take 5 mg by mouth at bedtime as needed for sleep., Disp: , Rfl:     No Known Allergies    Social History  Substance Use Topics  . Smoking status: Former Games developermoker  . Smokeless tobacco: Former NeurosurgeonUser    Quit date: 01/29/1991  . Alcohol use 1.0 oz/week    2 drink(s) per week    Family History  Problem Relation Age of Onset  . Diabetes Father   . Alzheimer's disease Father   . Colon cancer Father   . Breast cancer Sister      Review of Systems  Constitutional: Negative.   HENT:  Negative.   Eyes: Negative.   Respiratory: Negative.   Cardiovascular: Negative.   Gastrointestinal: Negative.   Genitourinary: Negative.   Musculoskeletal: Positive for back pain and joint pain.  Skin: Negative.   Neurological: Negative.   Endo/Heme/Allergies: Negative.   Psychiatric/Behavioral: Negative.     Objective:  Physical Exam  Constitutional: She is oriented to person, place, and time. She  appears well-developed and well-nourished.  HENT:  Head: Normocephalic and atraumatic.  Mouth/Throat: Oropharynx is clear and moist.  Eyes: Conjunctivae are normal. Pupils are equal, round, and reactive to light.  Neck: Neck supple.  Cardiovascular: Normal rate and regular rhythm.   Respiratory: Breath sounds normal.  GI: Bowel sounds are normal.  Genitourinary:  Genitourinary Comments: Not pertinent to current symptomatology therefore not examined.  Musculoskeletal:  Examination of her bilateral knees reveal pain bilaterally.  2-3+ crepitation.  1+ synovitis.  Range of motion 0-120 degrees.  Knees are stable with normal patella tracking.  Vascular exam: Pulses are 2+ and symmetric.    Neurological: She is alert and oriented to person, place, and time.  Skin: Skin is warm and dry.  Psychiatric: She has a normal mood and affect. Her behavior is normal.    Vital signs in last 24 hours:    Labs:   Estimated body mass index is 35.01 kg/m as calculated from the following:   Height as of 03/29/13: 5\' 5"  (1.651 m).   Weight as of 03/29/13: 95.4 kg (210 lb 6.4 oz).   Imaging Review Plain radiographs demonstrate severe degenerative joint disease of the left knee(s). The overall alignment issignificant varus. The bone quality appears to be good for age and reported activity level.  Assessment/Plan:  End stage arthritis, left knee  Principal Problem:   Primary localized osteoarthritis of left knee Active Problems:   Sleep apnea   Anxiety and depression   The patient history, physical examination, clinical judgment of the provider and imaging studies are consistent with end stage degenerative joint disease of the left knee(s) and total knee arthroplasty is deemed medically necessary. The treatment options including medical management, injection therapy arthroscopy and arthroplasty were discussed at length. The risks and benefits of total knee arthroplasty were presented and reviewed. The  risks due to aseptic loosening, infection, stiffness, patella tracking problems, thromboembolic complications and other imponderables were discussed. The patient acknowledged the explanation, agreed to proceed with the plan and consent was signed. Patient is being admitted for inpatient treatment for surgery, pain control, PT, OT, prophylactic antibiotics, VTE prophylaxis, progressive ambulation and ADL's and discharge planning. The patient is planning to be discharged home with home health services

## 2016-04-23 NOTE — Pre-Procedure Instructions (Signed)
Carolyn StareCeleste L Comins  04/23/2016      [id number]ato Battles- [place], [place] - [address] [place] [id number] Phone: [phone] Fax: [phone]    Your procedure is scheduled on Monday November 13.  [facility]:45 [facility].M.  Call this number if you have problems the morning of surgery:  [phone]   Remember:  Do not eat food or drink liquids after midnight.  Take these medicines the morning of surgery with A SIP OF WATER: escitalopram (Lexapro), Allegra  7 days prior to surgery STOP taking any Aspirin, Aleve, Naproxen, Ibuprofen, Motrin, Advil, Goody's, BC's, all herbal medications, fish oil, and all vitamins    Do not wear jewelry, make-up or nail polish.  Do not wear lotions, powders, or perfumes, or deoderant.  Do not shave 48 hours prior to surgery.  Men may shave face and neck.  Do not bring valuables to the hospital.  [facility] is not responsible for any belongings or valuables.  Contacts, dentures or bridgework may not be worn into surgery.  Leave your suitcase in the car.  After surgery it may be brought to your room.  For patients admitted to the hospital, discharge time will be determined by your treatment team.  Patients discharged the day of surgery will not be allowed to drive home.   Special instructions:     [place]- Preparing For Surgery  Before surgery, you can play an important role. Because skin is not sterile, your skin needs to be as free of germs as possible. You can reduce the number of germs on your skin by washing with CHG (chlorahexidine gluconate) Soap before surgery.  CHG is an antiseptic cleaner which kills germs and bonds with the skin to continue killing germs even after washing.  Please do not use if you have an allergy to CHG or antibacterial soaps. If your skin becomes reddened/irritated stop using the CHG.  Do not shave (including legs and underarms) for at least 48 hours prior to  first CHG shower. It is OK to shave your face.  Please follow these instructions carefully.   1. Shower the NIGHT BEFORE SURGERY and the MORNING OF SURGERY with CHG.   2. If you chose to wash your hair, wash your hair first as usual with your normal shampoo.  3. After you shampoo, rinse your hair and body thoroughly to remove the shampoo.  4. Use CHG as you would any other liquid soap. You can apply CHG directly to the skin and wash gently with a scrungie or a clean washcloth.   5. Apply the CHG Soap to your body ONLY FROM THE NECK DOWN.  Do not use on open wounds or open sores. Avoid contact with your eyes, ears, mouth and genitals (private parts). Wash genitals (private parts) with your normal soap.  6. Wash thoroughly, paying special attention to the area where your surgery will be performed.  7. Thoroughly rinse your body with warm water from the neck down.  8. DO NOT shower/wash with your normal soap after using and rinsing off the CHG Soap.  9. Pat yourself dry with a CLEAN TOWEL.   10. Wear CLEAN PAJAMAS   11. Place CLEAN SHEETS on your bed the night of your first shower and DO NOT SLEEP WITH PETS.    Day of Surgery: Do not apply any deodorants/lotions. Please wear clean clothes to the hospital/surgery center.      Please read  over the following fact sheets that you were given. MRSA Information

## 2016-04-24 ENCOUNTER — Encounter (HOSPITAL_COMMUNITY)
Admission: RE | Admit: 2016-04-24 | Discharge: 2016-04-24 | Disposition: A | Discharge status: Home / Self Care | Payer: BLUE CROSS/BLUE SHIELD | Source: Ambulatory Visit | Attending: Orthopedic Surgery | Admitting: Orthopedic Surgery

## 2016-04-24 ENCOUNTER — Encounter (HOSPITAL_COMMUNITY): Payer: Self-pay | Admitting: *Deleted

## 2016-04-24 DIAGNOSIS — M1712 Unilateral primary osteoarthritis, left knee: Secondary | ICD-10-CM | POA: Insufficient documentation

## 2016-04-24 DIAGNOSIS — Z01818 Encounter for other preprocedural examination: Secondary | ICD-10-CM | POA: Diagnosis present

## 2016-04-24 HISTORY — DX: Anxiety disorder, unspecified: F41.9

## 2016-04-24 LAB — COMPREHENSIVE METABOLIC PANEL
ALT: 23 U/L (ref 14–54)
AST: 21 U/L (ref 15–41)
Albumin: 4.1 g/dL (ref 3.5–5.0)
Alkaline Phosphatase: 32 U/L — ABNORMAL LOW (ref 38–126)
Anion gap: 8 (ref 5–15)
BILIRUBIN TOTAL: 0.5 mg/dL (ref 0.3–1.2)
BUN: 13 mg/dL (ref 6–20)
CALCIUM: 9.4 mg/dL (ref 8.9–10.3)
CO2: 23 mmol/L (ref 22–32)
CREATININE: 0.74 mg/dL (ref 0.44–1.00)
Chloride: 107 mmol/L (ref 101–111)
GFR calc Af Amer: >60 mL/min (ref >60)
Glucose, Bld: 112 mg/dL — ABNORMAL HIGH (ref 65–99)
POTASSIUM: 4 mmol/L (ref 3.5–5.1)
Sodium: 138 mmol/L (ref 135–145)
TOTAL PROTEIN: 7 g/dL (ref 6.5–8.1)

## 2016-04-24 LAB — CBC WITH DIFFERENTIAL/PLATELET
BASOS ABS: 0.1 10*3/uL (ref 0.0–0.1)
Basophils Relative: 2 %
Eosinophils Absolute: 0.2 10*3/uL (ref 0.0–0.7)
Eosinophils Relative: 3 %
HEMATOCRIT: 41.6 % (ref 36.0–46.0)
Hemoglobin: 13.5 g/dL (ref 12.0–15.0)
LYMPHS PCT: 31 %
Lymphs Abs: 1.9 10*3/uL (ref 0.7–4.0)
MCH: 29 pg (ref 26.0–34.0)
MCHC: 32.5 g/dL (ref 30.0–36.0)
MCV: 89.5 fL (ref 78.0–100.0)
MONO ABS: 0.5 10*3/uL (ref 0.1–1.0)
Monocytes Relative: 8 %
NEUTROS ABS: 3.5 10*3/uL (ref 1.7–7.7)
Neutrophils Relative %: 56 %
Platelets: 180 10*3/uL (ref 150–400)
RBC: 4.65 MIL/uL (ref 3.87–5.11)
RDW: 13.3 % (ref 11.5–15.5)
WBC: 6.2 10*3/uL (ref 4.0–10.5)

## 2016-04-24 LAB — ABO/RH: ABO/RH(D): A NEG

## 2016-04-24 LAB — TYPE AND SCREEN
ABO/RH(D): A NEG
Antibody Screen: NEGATIVE

## 2016-04-24 LAB — APTT: APTT: 29 s (ref 24–36)

## 2016-04-24 LAB — PROTIME-INR
INR: 1
PROTHROMBIN TIME: 13.2 s (ref 11.4–15.2)

## 2016-04-24 LAB — SURGICAL PCR SCREEN
MRSA, PCR: NEGATIVE
STAPHYLOCOCCUS AUREUS: NEGATIVE

## 2016-04-24 LAB — HCG, SERUM, QUALITATIVE: PREG SERUM: NEGATIVE

## 2016-04-24 NOTE — Progress Notes (Signed)
[facility].

## 2016-04-25 ENCOUNTER — Ambulatory Visit: Payer: BLUE CROSS/BLUE SHIELD | Admitting: Family

## 2016-04-25 LAB — URINE CULTURE: Culture: <10000 — AB

## 2016-04-28 ENCOUNTER — Ambulatory Visit
Admission: RE | Admit: 2016-04-28 | Discharge: 2016-04-28 | Disposition: A | Discharge status: Home / Self Care | Payer: BLUE CROSS/BLUE SHIELD | Source: Ambulatory Visit | Attending: Obstetrics and Gynecology | Admitting: Obstetrics and Gynecology

## 2016-04-28 DIAGNOSIS — Z803 Family history of malignant neoplasm of breast: Secondary | ICD-10-CM

## 2016-04-28 MED ORDER — GADOBENATE DIMEGLUMINE 529 MG/ML IV SOLN
20.0000 mL | Freq: Once | INTRAVENOUS | Status: AC | PRN
  Administered 2016-04-28: 20 mL via INTRAVENOUS

## 2016-05-05 ENCOUNTER — Encounter (HOSPITAL_COMMUNITY): Payer: Self-pay | Admitting: *Deleted

## 2016-05-05 ENCOUNTER — Inpatient Hospital Stay (HOSPITAL_COMMUNITY): Payer: BLUE CROSS/BLUE SHIELD | Admitting: Anesthesiology

## 2016-05-05 ENCOUNTER — Inpatient Hospital Stay (HOSPITAL_COMMUNITY)
Admission: RE | Admit: 2016-05-05 | Discharge: 2016-05-07 | DRG: 470 | Disposition: A | Discharge status: Home Health | Payer: BLUE CROSS/BLUE SHIELD | Source: Ambulatory Visit | Attending: Orthopedic Surgery | Admitting: Orthopedic Surgery

## 2016-05-05 ENCOUNTER — Encounter (HOSPITAL_COMMUNITY)
Admission: RE | Disposition: A | Discharge status: Home Health | Payer: Self-pay | Source: Ambulatory Visit | Attending: Orthopedic Surgery

## 2016-05-05 DIAGNOSIS — F32A Depression, unspecified: Secondary | ICD-10-CM | POA: Diagnosis present

## 2016-05-05 DIAGNOSIS — Z87891 Personal history of nicotine dependence: Secondary | ICD-10-CM | POA: Diagnosis not present

## 2016-05-05 DIAGNOSIS — F419 Anxiety disorder, unspecified: Secondary | ICD-10-CM | POA: Diagnosis present

## 2016-05-05 DIAGNOSIS — G4730 Sleep apnea, unspecified: Secondary | ICD-10-CM | POA: Diagnosis present

## 2016-05-05 DIAGNOSIS — Z79899 Other long term (current) drug therapy: Secondary | ICD-10-CM

## 2016-05-05 DIAGNOSIS — M1712 Unilateral primary osteoarthritis, left knee: Secondary | ICD-10-CM | POA: Diagnosis present

## 2016-05-05 DIAGNOSIS — F329 Major depressive disorder, single episode, unspecified: Secondary | ICD-10-CM | POA: Diagnosis present

## 2016-05-05 HISTORY — DX: Major depressive disorder, single episode, unspecified: F32.9

## 2016-05-05 HISTORY — DX: Depression, unspecified: F41.9

## 2016-05-05 HISTORY — DX: Unilateral primary osteoarthritis, left knee: M17.12

## 2016-05-05 HISTORY — DX: Depression, unspecified: F32.A

## 2016-05-05 HISTORY — DX: Sleep apnea, unspecified: G47.30

## 2016-05-05 HISTORY — PX: TOTAL KNEE ARTHROPLASTY: SHX125

## 2016-05-05 LAB — CBC
HCT: 41.8 % (ref 36.0–46.0)
Hemoglobin: 13.7 g/dL (ref 12.0–15.0)
MCH: 29.4 pg (ref 26.0–34.0)
MCHC: 32.8 g/dL (ref 30.0–36.0)
MCV: 89.7 fL (ref 78.0–100.0)
PLATELETS: 181 10*3/uL (ref 150–400)
RBC: 4.66 MIL/uL (ref 3.87–5.11)
RDW: 13 % (ref 11.5–15.5)
WBC: 9.3 10*3/uL (ref 4.0–10.5)

## 2016-05-05 LAB — CREATININE, SERUM
CREATININE: 0.87 mg/dL (ref 0.44–1.00)
GFR calc Af Amer: >60 mL/min (ref >60)
GFR calc non Af Amer: >60 mL/min (ref >60)

## 2016-05-05 SURGERY — ARTHROPLASTY, KNEE, TOTAL
Anesthesia: Spinal | Laterality: Left

## 2016-05-05 MED ORDER — MIDAZOLAM HCL 5 MG/5ML IJ SOLN
INTRAMUSCULAR | Status: DC | PRN
  Administered 2016-05-05: 1 mg via INTRAVENOUS

## 2016-05-05 MED ORDER — DOCUSATE SODIUM 100 MG PO CAPS
100.0000 mg | ORAL_CAPSULE | Freq: Two times a day (BID) | ORAL | Status: DC
  Administered 2016-05-05 – 2016-05-07 (×4): 100 mg via ORAL
  Filled 2016-05-05 (×4): qty 1

## 2016-05-05 MED ORDER — BUPIVACAINE HCL (PF) 0.25 % IJ SOLN
INTRAMUSCULAR | Status: AC
  Filled 2016-05-05: qty 30

## 2016-05-05 MED ORDER — HYDROMORPHONE HCL 2 MG/ML IJ SOLN
1.0000 mg | INTRAMUSCULAR | Status: DC | PRN
  Administered 2016-05-05 – 2016-05-06 (×4): 1 mg via INTRAVENOUS
  Filled 2016-05-05 (×4): qty 1

## 2016-05-05 MED ORDER — MIDAZOLAM HCL 2 MG/2ML IJ SOLN
2.0000 mg | Freq: Once | INTRAMUSCULAR | Status: AC
  Administered 2016-05-05: 2 mg via INTRAVENOUS

## 2016-05-05 MED ORDER — LIDOCAINE HCL (CARDIAC) 20 MG/ML IV SOLN
INTRAVENOUS | Status: DC | PRN
  Administered 2016-05-05: 60 mg via INTRAVENOUS

## 2016-05-05 MED ORDER — LIDOCAINE 2% (20 MG/ML) 5 ML SYRINGE
INTRAMUSCULAR | Status: AC
  Filled 2016-05-05: qty 5

## 2016-05-05 MED ORDER — PHENOL 1.4 % MT LIQD: 1.0000 | OROMUCOSAL | Status: DC | PRN

## 2016-05-05 MED ORDER — DEXAMETHASONE SODIUM PHOSPHATE 10 MG/ML IJ SOLN
INTRAMUSCULAR | Status: AC
  Filled 2016-05-05: qty 1

## 2016-05-05 MED ORDER — HYDROMORPHONE HCL 1 MG/ML IJ SOLN
0.2500 mg | INTRAMUSCULAR | Status: DC | PRN
  Administered 2016-05-05: 0.5 mg via INTRAVENOUS

## 2016-05-05 MED ORDER — POVIDONE-IODINE 7.5 % EX SOLN
Freq: Once | CUTANEOUS | Status: DC
Start: 2016-05-05 — End: 2016-05-05

## 2016-05-05 MED ORDER — DEXTROSE 5 % IV SOLN
3.0000 g | INTRAVENOUS | Status: AC
  Administered 2016-05-05: 3 g via INTRAVENOUS
  Filled 2016-05-05: qty 3000

## 2016-05-05 MED ORDER — CELECOXIB 200 MG PO CAPS
200.0000 mg | ORAL_CAPSULE | Freq: Two times a day (BID) | ORAL | Status: DC
  Administered 2016-05-05 – 2016-05-07 (×4): 200 mg via ORAL
  Filled 2016-05-05 (×4): qty 1

## 2016-05-05 MED ORDER — ESCITALOPRAM OXALATE 10 MG PO TABS
20.0000 mg | ORAL_TABLET | Freq: Every day | ORAL | Status: DC
  Administered 2016-05-06 – 2016-05-07 (×2): 20 mg via ORAL
  Filled 2016-05-05 (×2): qty 2

## 2016-05-05 MED ORDER — ZOLPIDEM TARTRATE 5 MG PO TABS
5.0000 mg | ORAL_TABLET | Freq: Every evening | ORAL | Status: DC | PRN
  Administered 2016-05-06: 5 mg via ORAL
  Filled 2016-05-05: qty 1

## 2016-05-05 MED ORDER — MENTHOL 3 MG MT LOZG: 1.0000 | LOZENGE | OROMUCOSAL | Status: DC | PRN

## 2016-05-05 MED ORDER — POLYETHYLENE GLYCOL 3350 17 G PO PACK
17.0000 g | PACK | Freq: Two times a day (BID) | ORAL | Status: DC
  Administered 2016-05-05 – 2016-05-07 (×4): 17 g via ORAL
  Filled 2016-05-05 (×4): qty 1

## 2016-05-05 MED ORDER — PROMETHAZINE HCL 25 MG/ML IJ SOLN: 6.2500 mg | INTRAMUSCULAR | Status: DC | PRN

## 2016-05-05 MED ORDER — OXYCODONE HCL 5 MG PO TABS
5.0000 mg | ORAL_TABLET | ORAL | Status: DC | PRN
  Administered 2016-05-05: 10 mg via ORAL
  Filled 2016-05-05 (×2): qty 2

## 2016-05-05 MED ORDER — ONDANSETRON HCL 4 MG/2ML IJ SOLN
INTRAMUSCULAR | Status: AC
  Filled 2016-05-05: qty 2

## 2016-05-05 MED ORDER — METOCLOPRAMIDE HCL 5 MG PO TABS: 5.0000 mg | ORAL_TABLET | Freq: Three times a day (TID) | ORAL | Status: DC | PRN

## 2016-05-05 MED ORDER — MIDAZOLAM HCL 2 MG/2ML IJ SOLN
INTRAMUSCULAR | Status: AC
  Filled 2016-05-05: qty 2

## 2016-05-05 MED ORDER — MIDAZOLAM HCL 2 MG/2ML IJ SOLN
INTRAMUSCULAR | Status: AC
  Administered 2016-05-05: 2 mg via INTRAVENOUS
  Filled 2016-05-05: qty 2

## 2016-05-05 MED ORDER — FENTANYL CITRATE (PF) 100 MCG/2ML IJ SOLN
INTRAMUSCULAR | Status: AC
  Filled 2016-05-05: qty 2

## 2016-05-05 MED ORDER — HYDROMORPHONE HCL 1 MG/ML IJ SOLN
INTRAMUSCULAR | Status: AC
  Filled 2016-05-05: qty 0.5

## 2016-05-05 MED ORDER — OXYCODONE HCL ER 10 MG PO T12A
10.0000 mg | EXTENDED_RELEASE_TABLET | Freq: Two times a day (BID) | ORAL | Status: DC
  Administered 2016-05-05: 10 mg via ORAL
  Filled 2016-05-05: qty 1

## 2016-05-05 MED ORDER — BUPIVACAINE IN DEXTROSE 0.75-8.25 % IT SOLN
INTRATHECAL | Status: DC | PRN
  Administered 2016-05-05: 2 mL via INTRATHECAL

## 2016-05-05 MED ORDER — SODIUM CHLORIDE 0.9 % IR SOLN
Status: DC | PRN
  Administered 2016-05-05: 1000 mL
  Administered 2016-05-05: 3000 mL

## 2016-05-05 MED ORDER — ONDANSETRON HCL 4 MG PO TABS: 4.0000 mg | ORAL_TABLET | Freq: Four times a day (QID) | ORAL | Status: DC | PRN

## 2016-05-05 MED ORDER — ACETAMINOPHEN 325 MG PO TABS: 650.0000 mg | ORAL_TABLET | Freq: Four times a day (QID) | ORAL | Status: DC | PRN

## 2016-05-05 MED ORDER — CHLORHEXIDINE GLUCONATE 4 % EX LIQD: 60.0000 mL | Freq: Once | CUTANEOUS | Status: DC

## 2016-05-05 MED ORDER — PROPOFOL 500 MG/50ML IV EMUL
INTRAVENOUS | Status: DC | PRN
  Administered 2016-05-05: 25 ug/kg/min via INTRAVENOUS

## 2016-05-05 MED ORDER — FENTANYL CITRATE (PF) 100 MCG/2ML IJ SOLN
50.0000 ug | Freq: Once | INTRAMUSCULAR | Status: AC
  Administered 2016-05-05: 50 ug via INTRAVENOUS

## 2016-05-05 MED ORDER — FENTANYL CITRATE (PF) 100 MCG/2ML IJ SOLN
INTRAMUSCULAR | Status: AC
  Administered 2016-05-05: 50 ug via INTRAVENOUS
  Filled 2016-05-05: qty 2

## 2016-05-05 MED ORDER — CHLORHEXIDINE GLUCONATE 4 % EX LIQD
60.0000 mL | Freq: Once | CUTANEOUS | Status: DC
Start: 2016-05-05 — End: 2016-05-05

## 2016-05-05 MED ORDER — FENTANYL CITRATE (PF) 100 MCG/2ML IJ SOLN
INTRAMUSCULAR | Status: DC | PRN
  Administered 2016-05-05: 50 ug via INTRAVENOUS

## 2016-05-05 MED ORDER — BUPIVACAINE-EPINEPHRINE 0.25% -1:200000 IJ SOLN
INTRAMUSCULAR | Status: DC | PRN
Start: 2016-05-05 — End: 2016-05-05
  Administered 2016-05-05: 30 mL

## 2016-05-05 MED ORDER — DIPHENHYDRAMINE HCL 12.5 MG/5ML PO ELIX
12.5000 mg | ORAL_SOLUTION | ORAL | Status: DC | PRN
  Administered 2016-05-05: 25 mg via ORAL
  Filled 2016-05-05: qty 10

## 2016-05-05 MED ORDER — POTASSIUM CHLORIDE IN NACL 20-0.9 MEQ/L-% IV SOLN
INTRAVENOUS | Status: DC
  Administered 2016-05-05: 19:00:00 via INTRAVENOUS
  Filled 2016-05-05 (×2): qty 1000

## 2016-05-05 MED ORDER — LACTATED RINGERS IV SOLN
INTRAVENOUS | Status: DC
  Administered 2016-05-05 (×2): via INTRAVENOUS
  Administered 2016-05-05: 20 mL/h via INTRAVENOUS

## 2016-05-05 MED ORDER — ALUM & MAG HYDROXIDE-SIMETH 200-200-20 MG/5ML PO SUSP: 30.0000 mL | ORAL | Status: DC | PRN

## 2016-05-05 MED ORDER — TRANEXAMIC ACID 1000 MG/10ML IV SOLN
1000.0000 mg | INTRAVENOUS | Status: DC
  Filled 2016-05-05: qty 10

## 2016-05-05 MED ORDER — WHITE PETROLATUM GEL
Status: AC
  Filled 2016-05-05: qty 1

## 2016-05-05 MED ORDER — ONDANSETRON HCL 4 MG/2ML IJ SOLN: 4.0000 mg | Freq: Four times a day (QID) | INTRAMUSCULAR | Status: DC | PRN

## 2016-05-05 MED ORDER — VITAMIN C 500 MG PO TABS
250.0000 mg | ORAL_TABLET | Freq: Every day | ORAL | Status: DC
  Administered 2016-05-05 – 2016-05-07 (×3): 250 mg via ORAL
  Filled 2016-05-05 (×3): qty 1

## 2016-05-05 MED ORDER — METOCLOPRAMIDE HCL 5 MG/ML IJ SOLN: 5.0000 mg | Freq: Three times a day (TID) | INTRAMUSCULAR | Status: DC | PRN

## 2016-05-05 MED ORDER — ACETAMINOPHEN 650 MG RE SUPP: 650.0000 mg | Freq: Four times a day (QID) | RECTAL | Status: DC | PRN

## 2016-05-05 MED ORDER — PHENYLEPHRINE HCL 10 MG/ML IJ SOLN
INTRAVENOUS | Status: DC | PRN
  Administered 2016-05-05: 20 ug/min via INTRAVENOUS

## 2016-05-05 MED ORDER — ENOXAPARIN SODIUM 30 MG/0.3ML ~~LOC~~ SOLN
30.0000 mg | Freq: Two times a day (BID) | SUBCUTANEOUS | Status: DC
  Administered 2016-05-06 – 2016-05-07 (×3): 30 mg via SUBCUTANEOUS
  Filled 2016-05-05 (×3): qty 0.3

## 2016-05-05 MED ORDER — CEFAZOLIN SODIUM-DEXTROSE 2-4 GM/100ML-% IV SOLN
2.0000 g | Freq: Three times a day (TID) | INTRAVENOUS | Status: AC
  Administered 2016-05-05 – 2016-05-06 (×2): 2 g via INTRAVENOUS
  Filled 2016-05-05 (×2): qty 100

## 2016-05-05 MED ORDER — DEXAMETHASONE SODIUM PHOSPHATE 10 MG/ML IJ SOLN
INTRAMUSCULAR | Status: DC | PRN
  Administered 2016-05-05: 10 mg via INTRAVENOUS

## 2016-05-05 MED ORDER — ONDANSETRON HCL 4 MG/2ML IJ SOLN
INTRAMUSCULAR | Status: DC | PRN
  Administered 2016-05-05: 4 mg via INTRAVENOUS

## 2016-05-05 MED ORDER — DEXAMETHASONE SODIUM PHOSPHATE 10 MG/ML IJ SOLN
10.0000 mg | Freq: Three times a day (TID) | INTRAMUSCULAR | Status: AC
  Administered 2016-05-05 – 2016-05-06 (×4): 10 mg via INTRAVENOUS
  Filled 2016-05-05 (×4): qty 1

## 2016-05-05 MED ORDER — LORATADINE 10 MG PO TABS
10.0000 mg | ORAL_TABLET | Freq: Every day | ORAL | Status: DC
  Administered 2016-05-06 – 2016-05-07 (×2): 10 mg via ORAL
  Filled 2016-05-05 (×2): qty 1

## 2016-05-05 MED ORDER — VITAMIN D 1000 UNITS PO TABS
1000.0000 [IU] | ORAL_TABLET | Freq: Every day | ORAL | Status: DC
  Administered 2016-05-05 – 2016-05-07 (×3): 1000 [IU] via ORAL
  Filled 2016-05-05 (×3): qty 1

## 2016-05-05 MED ORDER — TRANEXAMIC ACID 1000 MG/10ML IV SOLN
INTRAVENOUS | Status: DC | PRN
  Administered 2016-05-05: 1000 mg via INTRAVENOUS

## 2016-05-05 MED ORDER — CLONAZEPAM 1 MG PO TABS
1.0000 mg | ORAL_TABLET | Freq: Every day | ORAL | Status: DC
  Administered 2016-05-05: 1 mg via ORAL
  Filled 2016-05-05: qty 1

## 2016-05-05 SURGICAL SUPPLY — 76 items
APL SKNCLS STERI-STRIP NONHPOA (GAUZE/BANDAGES/DRESSINGS) ×1
BANDAGE ELASTIC 3 VELCRO ST LF (GAUZE/BANDAGES/DRESSINGS) ×2 IMPLANT
BANDAGE ESMARK 6X9 LF (GAUZE/BANDAGES/DRESSINGS) ×1 IMPLANT
BENZOIN TINCTURE PRP APPL 2/3 (GAUZE/BANDAGES/DRESSINGS) ×3 IMPLANT
BLADE SAGITTAL 25.0X1.19X90 (BLADE) ×3 IMPLANT
BLADE SAGITTAL 25.0X1.19X90MM (BLADE) ×2
BLADE SAW SGTL 13X75X1.27 (BLADE) ×3 IMPLANT
BLADE SURG 10 STRL SS (BLADE) ×6 IMPLANT
BNDG CMPR 9X6 STRL LF SNTH (GAUZE/BANDAGES/DRESSINGS) ×1
BNDG CMPR MED 15X6 ELC VLCR LF (GAUZE/BANDAGES/DRESSINGS) ×1
BNDG ELASTIC 6X15 VLCR STRL LF (GAUZE/BANDAGES/DRESSINGS) ×3 IMPLANT
BNDG ESMARK 6X9 LF (GAUZE/BANDAGES/DRESSINGS) ×3
BOWL SMART MIX CTS (DISPOSABLE) ×3 IMPLANT
CAPT KNEE TOTAL 3 ATTUNE ×2 IMPLANT
CEMENT HV SMART SET (Cement) ×6 IMPLANT
CLOSURE WOUND 1/2 X4 (GAUZE/BANDAGES/DRESSINGS) ×1
COVER SURGICAL LIGHT HANDLE (MISCELLANEOUS) ×3 IMPLANT
CUFF TOURNIQUET SINGLE 34IN LL (TOURNIQUET CUFF) ×3 IMPLANT
CUFF TOURNIQUET SINGLE 44IN (TOURNIQUET CUFF) IMPLANT
DECANTER SPIKE VIAL GLASS SM (MISCELLANEOUS) ×3 IMPLANT
DRAPE EXTREMITY T 121X128X90 (DRAPE) ×3 IMPLANT
DRAPE HALF SHEET 40X57 (DRAPES) ×3 IMPLANT
DRAPE INCISE IOBAN 66X45 STRL (DRAPES) ×3 IMPLANT
DRAPE PROXIMA HALF (DRAPES) ×3 IMPLANT
DRAPE U-SHAPE 47X51 STRL (DRAPES) ×3 IMPLANT
DRSG AQUACEL AG ADV 3.5X14 (GAUZE/BANDAGES/DRESSINGS) ×3 IMPLANT
DURAPREP 26ML APPLICATOR (WOUND CARE) ×6 IMPLANT
ELECT CAUTERY BLADE 6.4 (BLADE) ×3 IMPLANT
ELECT REM PT RETURN 9FT ADLT (ELECTROSURGICAL) ×3
ELECTRODE REM PT RTRN 9FT ADLT (ELECTROSURGICAL) ×1 IMPLANT
FACESHIELD WRAPAROUND (MASK) ×3 IMPLANT
FACESHIELD WRAPAROUND OR TEAM (MASK) ×1 IMPLANT
GLOVE BIO SURGEON STRL SZ7 (GLOVE) ×7 IMPLANT
GLOVE BIOGEL PI IND STRL 7.0 (GLOVE) ×1 IMPLANT
GLOVE BIOGEL PI IND STRL 7.5 (GLOVE) ×1 IMPLANT
GLOVE BIOGEL PI INDICATOR 7.0 (GLOVE) ×6
GLOVE BIOGEL PI INDICATOR 7.5 (GLOVE) ×2
GLOVE EUDERMIC 7 POWDERFREE (GLOVE) ×6 IMPLANT
GLOVE INDICATOR 7.0 STRL GRN (GLOVE) ×6 IMPLANT
GLOVE SS BIOGEL STRL SZ 7.5 (GLOVE) ×1 IMPLANT
GLOVE SUPERSENSE BIOGEL SZ 7.5 (GLOVE) ×2
GOWN STRL REUS W/ TWL LRG LVL3 (GOWN DISPOSABLE) ×1 IMPLANT
GOWN STRL REUS W/ TWL XL LVL3 (GOWN DISPOSABLE) ×2 IMPLANT
GOWN STRL REUS W/TWL LRG LVL3 (GOWN DISPOSABLE) ×3
GOWN STRL REUS W/TWL XL LVL3 (GOWN DISPOSABLE) ×6
HANDPIECE INTERPULSE COAX TIP (DISPOSABLE) ×3
HOOD PEEL AWAY FACE SHEILD DIS (HOOD) ×6 IMPLANT
IMMOBILIZER KNEE 22 UNIV (SOFTGOODS) ×3 IMPLANT
KIT BASIN OR (CUSTOM PROCEDURE TRAY) ×3 IMPLANT
KIT ROOM TURNOVER OR (KITS) ×3 IMPLANT
MANIFOLD NEPTUNE II (INSTRUMENTS) ×3 IMPLANT
MARKER SKIN DUAL TIP RULER LAB (MISCELLANEOUS) ×3 IMPLANT
NDL 18GX1X1/2 (RX/OR ONLY) (NEEDLE) ×1 IMPLANT
NEEDLE 18GX1X1/2 (RX/OR ONLY) (NEEDLE) ×3 IMPLANT
NS IRRIG 1000ML POUR BTL (IV SOLUTION) ×3 IMPLANT
PACK TOTAL JOINT (CUSTOM PROCEDURE TRAY) ×3 IMPLANT
PAD ARMBOARD 7.5X6 YLW CONV (MISCELLANEOUS) ×6 IMPLANT
SET HNDPC FAN SPRY TIP SCT (DISPOSABLE) ×1 IMPLANT
STRIP CLOSURE SKIN 1/2X4 (GAUZE/BANDAGES/DRESSINGS) ×2 IMPLANT
SUCTION FRAZIER HANDLE 10FR (MISCELLANEOUS) ×2
SUCTION TUBE FRAZIER 10FR DISP (MISCELLANEOUS) ×1 IMPLANT
SUT MNCRL AB 3-0 PS2 18 (SUTURE) ×3 IMPLANT
SUT VIC AB 0 CT1 27 (SUTURE) ×6
SUT VIC AB 0 CT1 27XBRD ANBCTR (SUTURE) ×2 IMPLANT
SUT VIC AB 1 CT1 27 (SUTURE) ×3
SUT VIC AB 1 CT1 27XBRD ANBCTR (SUTURE) ×1 IMPLANT
SUT VIC AB 2-0 CT1 27 (SUTURE) ×6
SUT VIC AB 2-0 CT1 TAPERPNT 27 (SUTURE) ×2 IMPLANT
SYR 30ML LL (SYRINGE) ×3 IMPLANT
TOWEL OR 17X24 6PK STRL BLUE (TOWEL DISPOSABLE) ×3 IMPLANT
TOWEL OR 17X26 10 PK STRL BLUE (TOWEL DISPOSABLE) ×3 IMPLANT
TRAY CATH 16FR W/PLASTIC CATH (SET/KITS/TRAYS/PACK) IMPLANT
TRAY FOLEY CATH 16FR SILVER (SET/KITS/TRAYS/PACK) ×3 IMPLANT
TUBE CONNECTING 12'X1/4 (SUCTIONS) ×1
TUBE CONNECTING 12X1/4 (SUCTIONS) ×2 IMPLANT
YANKAUER SUCT BULB TIP NO VENT (SUCTIONS) ×3 IMPLANT

## 2016-05-05 NOTE — Progress Notes (Signed)
Patient has home mask for CPAP but does not wish to wear tonight. RT will continue to monitor.

## 2016-05-05 NOTE — Evaluation (Signed)
Physical Therapy Evaluation Patient Details Name: Joanna Vazquez MRN: [id number] DOB: Jun 12, 1956 Today's Date: 05/05/2016   History of Present Illness  60 yo female with onset of OA now presenting for L TKA with immob when up.  PMHx:  sleep apnea, OA, urinary incontinence, anxiety  Clinical Impression  Pt is up to walk with assistance, has been able to tolerate her immobilizer and ROM initiation to L knee.  Will follow acutely and will need to try stairs before leaving as she is going to have to enter house with them.  Follow up with HHPT and per pt will have husband to assist her at home initially.      Follow Up Recommendations Home health PT;Supervision for mobility/OOB    Equipment Recommendations  Rolling walker with 5" wheels (if pt has a walker that is not in good shape)    Recommendations for Other Services       Precautions / Restrictions Precautions Precautions: Knee;Fall Precaution Booklet Issued: No Required Braces or Orthoses: Knee Immobilizer - Left Knee Immobilizer - Left: On when out of bed or walking Restrictions Weight Bearing Restrictions: Yes LLE Weight Bearing: Weight bearing as tolerated Other Position/Activity Restrictions: bone foam on in bed      Mobility  Bed Mobility Overal bed mobility: Needs Assistance Bed Mobility: Supine to Sit;Sit to Supine     Supine to sit: Min assist Sit to supine: Min assist;Mod assist   General bed mobility comments: help mainly to reassist with legs onto bed after a walk  Transfers Overall transfer level: Needs assistance Equipment used: Rolling walker (2 wheeled);1 person hand held assist Transfers: Sit to/from [employer] CorporationStand;Stand Pivot Transfers Sit to Stand: Min guard;Min assist Stand pivot transfers: Min guard       General transfer comment: cued hand placement and safety  Ambulation/Gait Ambulation/Gait assistance: Min assist;Min guard (mainly due to lines and catheter) Ambulation Distance (Feet): 100  Feet Assistive device: Rolling walker (2 wheeled);1 person hand held assist Gait Pattern/deviations: Step-through pattern;Step-to pattern;Wide base of support;Decreased stride length;Decreased stance time - left Gait velocity: reduced Gait velocity interpretation: Below normal speed for age/gender    Stairs            Wheelchair Mobility    Modified Rankin (Stroke Patients Only)       Balance Overall balance assessment: Needs assistance Sitting-balance support: Feet supported Sitting balance-Leahy Scale: Good   Postural control: Posterior lean Standing balance support: Bilateral upper extremity supported Standing balance-Leahy Scale: Fair                               Pertinent Vitals/Pain Pain Assessment: 0-10 Pain Score: 4  Pain Location: L knee Pain Descriptors / Indicators: Operative site guarding Pain Intervention(s): Limited activity within patient's tolerance;Monitored during session;Premedicated before session;Repositioned;Ice applied    Home Living Family/patient expects to be discharged to:: Private residence Living Arrangements: Spouse/significant other Available Help at Discharge: Family;Available 24 hours/day (initally) Type of Home: House Home Access: Stairs to enter Entrance Stairs-Rails: None Entrance Stairs-Number of Steps: 2 Home Layout: Two level Home Equipment: Walker - 2 wheels;Cane - single point;Shower seat - built in      Prior Function Level of Independence: Independent         Comments: drives and runs a business     Hand Dominance   Dominant Hand: Right    Extremity/Trunk Assessment   Upper Extremity Assessment: Overall WFL for tasks assessed  Lower Extremity Assessment: LLE deficits/detail   LLE Deficits / Details: immobilized and stiff with surgery  Cervical / Trunk Assessment: Normal  Communication   Communication: No difficulties  Cognition Arousal/Alertness: Awake/alert Behavior  During Therapy: WFL for tasks assessed/performed Overall Cognitive Status: Within Functional Limits for tasks assessed                      General Comments      Exercises     Assessment/Plan    PT Assessment Patient needs continued PT services  PT Problem List Decreased strength;Decreased range of motion;Decreased activity tolerance;Decreased balance;Decreased mobility;Decreased knowledge of use of DME;Decreased safety awareness;Decreased knowledge of precautions;Decreased skin integrity;Pain          PT Treatment Interventions Gait training;Stair training;Functional mobility training;Therapeutic activities;Therapeutic exercise;Balance training;Neuromuscular re-education;Patient/family education;DME instruction    PT Goals (Current goals can be found in the Care Plan section)  Acute Rehab PT Goals 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 PT Goal Formulation: With patient Time For Goal Achievement: 05/19/16 Potential to Achieve Goals: Good    Frequency 7X/week   Barriers to discharge Inaccessible home environment stairs for all entrances    Co-evaluation               End of Session Equipment Utilized During Treatment: Gait belt;Left knee immobilizer Activity Tolerance: Patient tolerated treatment well;Patient limited by fatigue;Patient limited by pain Patient left: in bed;with call bell/phone within reach;with bed alarm set Nurse Communication: Mobility status         Time: [id number] PT Time Calculation (min) (ACUTE ONLY): 29 min   Charges:   PT Evaluation $PT Eval Low Complexity: 1 Procedure PT Treatments $Gait Training: 8-22 mins   PT G CodesIvar Vazquez:        Joanna Vazquez 05/05/2016, 6:55 PM    Joanna Vazquez, PT MS Acute Rehab Dept. Number: [facility] [phone] and [facility] [phone]

## 2016-05-05 NOTE — Op Note (Signed)
MRN:     [id number] DOB/AGE:    1955/09/15 / 60 y.o.       OPERATIVE REPORT    DATE OF PROCEDURE:  05/05/2016       PREOPERATIVE DIAGNOSIS:   PRIMARY LOCALIZED OA LEFT KNEE      Estimated body mass index is 35.36 kg/m as calculated from the following:   Height as of this encounter: 5\' 4"  (1.626 m).   Weight as of this encounter: 93.4 kg (206 lb).                                                        POSTOPERATIVE DIAGNOSIS:   SAME                                                                     PROCEDURE:  Procedure(s): TOTAL KNEE ARTHROPLASTY Using Depuy Attune RP implants #5 narrow Femur, #4Tibia, 7mm  RP bearing, 29 Patella     SURGEON: Doreena Maulden A    ASSISTANT:  Kirstin Shepperson PA-C   (Present and scrubbed throughout the case, critical for assistance with exposure, retraction, instrumentation, and closure.)         ANESTHESIA: Spinal with Adductor Nerve Block     TOURNIQUET TIME: 80min   COMPLICATIONS:  None     SPECIMENS: None   INDICATIONS FOR PROCEDURE: The patient has  DJD LEFT KNEE, varus deformities, XR shows bone on bone arthritis. Patient has failed all conservative measures including anti-inflammatory medicines, narcotics, attempts at  exercise and weight loss, cortisone injections and viscosupplementation.  Risks and benefits of surgery have been discussed, questions answered.   DESCRIPTION OF PROCEDURE: The patient identified by armband, received  right femoral nerve block and IV antibiotics, [facility]. Patient taken to the operating room, appropriate anesthetic  monitors were attached Spinal anesthesia induced with  the patient in supine position, Foley catheter was inserted. Tourniquet  applied high to the operative thigh. Lateral post and foot positioner  applied to the table, the lower extremity was then prepped and draped  in usual sterile fashion from the ankle to the tourniquet. Time-out procedure was performed.  The limb was wrapped with an Esmarch bandage and the tourniquet inflated to 365 mmHg. We began the operation by making the anterior midline incision starting at handbreadth above the patella going over the patella 1 cm medial to and  4 cm distal to the tibial tubercle. Small bleeders in the skin and the  subcutaneous tissue identified and cauterized. Transverse retinaculum was incised and reflected medially and a medial parapatellar arthrotomy was accomplished. the patella was everted and theprepatellar fat pad resected. The superficial medial collateral  ligament was then elevated from anterior to posterior along the proximal  flare of the tibia and anterior half of the menisci resected. The knee was hyperflexed exposing bone on bone arthritis. Peripheral and notch osteophytes as well as the cruciate ligaments were then resected. We continued to  work our way around posteriorly along the proximal tibia, and externally  rotated the tibia subluxing it out  from underneath the femur. A McHale  retractor was placed through the notch and a lateral Hohmann retractor  placed, and we then drilled through the proximal tibia in line with the  axis of the tibia followed by an intramedullary guide rod and 2-degree  posterior slope cutting guide. The tibial cutting guide was pinned into place  allowing resection of 4 mm of bone medially and about 6 mm of bone  laterally because of her varus deformity. Satisfied with the tibial resection, we then  entered the distal femur 2 mm anterior to the PCL origin with the  intramedullary guide rod and applied the distal femoral cutting guide  set at 11mm, with 5 degrees of valgus. This was pinned along the  epicondylar axis. At this point, the distal femoral cut was accomplished without difficulty. We then sized for a #5 narrow femoral component and pinned the guide in 3 degrees of external rotation.The chamfer cutting guide was pinned into place. The anterior, posterior,  and chamfer cuts were accomplished without difficulty followed by  the  RP box cutting guide and the box cut. We also removed posterior osteophytes from the posterior femoral condyles. At this  time, the knee was brought into full extension. We checked our  extension and flexion gaps and found them symmetric at 7mm.  The patella thickness measured at 21 mm. We set the cutting guide at 12 and removed the posterior 9 mm  of the patella sized for 29 button and drilled the lollipop. The knee  was then once again hyperflexed exposing the proximal tibia. We sized for a #4 tibial base plate, applied the smokestack and the conical reamer followed by the the Delta fin keel punch. We then hammered into place the  RP trial femoral component, inserted a 1 trial bearing, trial patellar button, and took the knee through range of motion from 0-130 degrees. No thumb pressure was required for patellar  tracking. At this point, all trial components were removed, a double batch of DePuy HV cement  was mixed and applied to all bony metallic mating surfaces except for the posterior condyles of the femur itself. In order, we  hammered into place the tibial tray and removed excess cement, the femoral component and removed excess cement, a 7mm  RP bearing  was inserted, and the knee brought to full extension with compression.  The patellar button was clamped into place, and excess cement  removed. While the cement cured the wound was irrigated out with normal saline solution pulse lavage.. Ligament stability and patellar tracking were checked and found to be excellent.. The parapatellar arthrotomy was closed with  #1 Vicryl suture. The subcutaneous tissue with 0 and 2-0 undyed  Vicryl suture, and 4-0 Monocryl.. A dressing of Aquaseal,  4 x 4, dressing sponges, Webril, and Ace wrap applied. Needle and sponge count were correct times 2.The patient awakened, extubated, and taken to recovery room without difficulty. Vascular  status was normal, pulses 2+ and symmetric.   Thales Knipple A 05/05/2016, 12:07 PM

## 2016-05-05 NOTE — Anesthesia Postprocedure Evaluation (Signed)
Anesthesia Post Note  Patient: Joanna Vazquez  Procedure(s) Performed: Procedure(s) (LRB): TOTAL KNEE ARTHROPLASTY (Left)  Patient location during evaluation: PACU Anesthesia Type: Spinal Level of consciousness: oriented and awake and alert Pain management: pain level controlled Vital Signs Assessment: post-procedure vital signs reviewed and stable Respiratory status: spontaneous breathing, respiratory function stable and patient connected to nasal cannula oxygen Cardiovascular status: blood pressure returned to baseline and stable Postop Assessment: no headache and no backache Anesthetic complications: no    Last Vitals:  Vitals:   05/05/16 1245 05/05/16 1300  BP: 100/70 102/71  Pulse:    Resp:    Temp:      Last Pain:  Vitals:   05/05/16 0910  TempSrc:   PainSc: 0-No pain                 Linsy Ehresman S

## 2016-05-05 NOTE — Transfer of Care (Signed)
Immediate Anesthesia Transfer of Care Note  Patient: Joanna Vazquez  Procedure(s) Performed: Procedure(s): TOTAL KNEE ARTHROPLASTY (Left)  Patient Location: PACU  Anesthesia Type:Spinal and MAC combined with regional for post-op pain  Level of Consciousness: awake, alert , oriented and patient cooperative  Airway & Oxygen Therapy: Patient Spontanous Breathing and Patient connected to nasal cannula oxygen  Post-op Assessment: Report given to RN, Post -op Vital signs reviewed and stable and Patient moving all extremities   Post vital signs: Reviewed and stable  Last Vitals:  Vitals:   05/05/16 0910 05/05/16 1240  BP: 118/73   Pulse: 63   Resp: 10   Temp:  36.5 C    Last Pain:  Vitals:   05/05/16 0910  TempSrc:   PainSc: 0-No pain      Patients Stated Pain Goal: 3 (05/05/16 0831)  Complications: No apparent anesthesia complications

## 2016-05-05 NOTE — Interval H&P Note (Signed)
History and Physical Interval Note:  05/05/2016 9:21 AM  Joanna Vazquez  has presented today for surgery, with the diagnosis of DJD LEFT KNEE  The various methods of treatment have been discussed with the patient and family. After consideration of risks, benefits and other options for treatment, the patient has consented to  Procedure(s): TOTAL KNEE ARTHROPLASTY (Left) as a surgical intervention .  The patient's history has been reviewed, patient examined, no change in status, stable for surgery.  I have reviewed the patient's chart and labs.  Questions were answered to the patient's satisfaction.     Salvatore MarvelWAINER,Shamela Haydon A

## 2016-05-05 NOTE — Progress Notes (Signed)
Orthopedic Tech Progress Note Patient Details:  Carolyn StareCeleste L Grays April 26, 1956 [id number]  CPM Left Knee CPM Left Knee: On Left Knee Flexion (Degrees): 90 Left Knee Extension (Degrees): 0 Additional Comments: Placed on Left leg/ Knee   [name], Caiden Arteaga C 05/05/2016, 1:54 PM

## 2016-05-05 NOTE — Progress Notes (Signed)
Orthopedic Tech Progress Note Patient Details:  [name] Aug 10, 1955 [id number]   Applied Trapeze Bar Patient Helper.   [name], [name] 05/05/2016, 1:59 PM

## 2016-05-05 NOTE — Anesthesia Procedure Notes (Signed)
Spinal  Patient location during procedure: OR Staffing Anesthesiologist: Christia Coaxum Performed: anesthesiologist  Preanesthetic Checklist Completed: patient identified, site marked, surgical consent, pre-op evaluation, timeout performed, IV checked, risks and benefits discussed and monitors and equipment checked Spinal Block Patient position: sitting Prep: Betadine Patient monitoring: heart rate, continuous pulse ox and blood pressure Injection technique: single-shot Needle Needle type: Sprotte  Needle gauge: 24 G Needle length: 9 cm Additional Notes Expiration date of kit checked and confirmed. Patient tolerated procedure well, without complications.       

## 2016-05-05 NOTE — Anesthesia Preprocedure Evaluation (Addendum)
Anesthesia Evaluation  Patient identified by MRN, date of birth, ID band Patient awake    Reviewed: Allergy & Precautions, NPO status , Patient's Chart, lab work & pertinent test results  Airway Mallampati: II  TM Distance: >3 FB Neck ROM: Full    Dental no notable dental hx. (+) Teeth Intact, Dental Advisory Given   Pulmonary neg pulmonary ROS, sleep apnea and Continuous Positive Airway Pressure Ventilation , former smoker,    Pulmonary exam normal breath sounds clear to auscultation       Cardiovascular negative cardio ROS Normal cardiovascular exam Rhythm:Regular Rate:Normal     Neuro/Psych negative neurological ROS  negative psych ROS   GI/Hepatic negative GI ROS, Neg liver ROS,   Endo/Other  negative endocrine ROS  Renal/GU negative Renal ROS  negative genitourinary   Musculoskeletal negative musculoskeletal ROS (+) Arthritis ,   Abdominal   Peds negative pediatric ROS (+)  Hematology negative hematology ROS (+)   Anesthesia Other Findings   Reproductive/Obstetrics negative OB ROS                            Anesthesia Physical Anesthesia Plan  ASA: II  Anesthesia Plan: Spinal   Post-op Pain Management:    Induction: Intravenous  Airway Management Planned: Simple Face Mask  Additional Equipment:   Intra-op Plan:   Post-operative Plan:   Informed Consent: I have reviewed the patients History and Physical, chart, labs and discussed the procedure including the risks, benefits and alternatives for the proposed anesthesia with the patient or authorized representative who has indicated his/her understanding and acceptance.   Dental advisory given  Plan Discussed with: CRNA and Surgeon  Anesthesia Plan Comments:         Anesthesia Quick Evaluation

## 2016-05-06 ENCOUNTER — Encounter (HOSPITAL_COMMUNITY): Payer: Self-pay | Admitting: Orthopedic Surgery

## 2016-05-06 LAB — CBC
HEMATOCRIT: 37.6 % (ref 36.0–46.0)
Hemoglobin: 12.3 g/dL (ref 12.0–15.0)
MCH: 29.4 pg (ref 26.0–34.0)
MCHC: 32.7 g/dL (ref 30.0–36.0)
MCV: 90 fL (ref 78.0–100.0)
PLATELETS: 177 10*3/uL (ref 150–400)
RBC: 4.18 MIL/uL (ref 3.87–5.11)
RDW: 12.9 % (ref 11.5–15.5)
WBC: 16 10*3/uL — AB (ref 4.0–10.5)

## 2016-05-06 LAB — BASIC METABOLIC PANEL
ANION GAP: 7 (ref 5–15)
BUN: 9 mg/dL (ref 6–20)
CALCIUM: 8.9 mg/dL (ref 8.9–10.3)
CO2: 25 mmol/L (ref 22–32)
CREATININE: 0.9 mg/dL (ref 0.44–1.00)
Chloride: 108 mmol/L (ref 101–111)
Glucose, Bld: 185 mg/dL — ABNORMAL HIGH (ref 65–99)
Potassium: 4.4 mmol/L (ref 3.5–5.1)
SODIUM: 140 mmol/L (ref 135–145)

## 2016-05-06 MED ORDER — HYDROMORPHONE HCL 2 MG PO TABS
2.0000 mg | ORAL_TABLET | ORAL | Status: DC | PRN
  Administered 2016-05-06 – 2016-05-07 (×5): 2 mg via ORAL
  Filled 2016-05-06 (×5): qty 1

## 2016-05-06 MED ORDER — MORPHINE SULFATE (PF) 2 MG/ML IV SOLN: 2.0000 mg | INTRAVENOUS | Status: DC | PRN

## 2016-05-06 NOTE — Progress Notes (Signed)
Orthopedic Tech Progress Note Patient Details:  Joanna Vazquez [birth year] [id number]  Ortho Devices Ortho Device/Splint Location: foot roll   Saul FordyceJennifer C Brittie Whisnant 05/06/2016, 3:57 PM

## 2016-05-06 NOTE — Progress Notes (Signed)
Physical Therapy Treatment Patient Details Name: Joanna Vazquez MRN: [id number] DOB: 27-Dec-1955 Today's Date: 05/06/2016    History of Present Illness 60 yo female with onset of OA now presenting for L TKA with immob when up.  PMHx:  sleep apnea, OA, urinary incontinence, anxiety    PT Comments    Patient is progressing well toward mobility goals. Continue to progress as tolerated with anticipated d/c home with HHPT.   Follow Up Recommendations  Home health PT;Supervision for mobility/OOB     Equipment Recommendations       Recommendations for Other Services       Precautions / Restrictions Precautions Precautions: Knee;Fall Precaution Booklet Issued: No Precaution Comments: No order for KI but has one in the room Required Braces or Orthoses: Knee Immobilizer - Left Knee Immobilizer - Left: On when out of bed or walking Restrictions Weight Bearing Restrictions: Yes LLE Weight Bearing: Weight bearing as tolerated Other Position/Activity Restrictions: bone foam on in bed    Mobility  Bed Mobility               General bed mobility comments: pt OOB in chair upon arrival  Transfers Overall transfer level: Needs assistance Equipment used: Rolling walker (2 wheeled);1 person hand held assist Transfers: Sit to/from Stand Sit to Stand: Min guard Stand pivot transfers: Supervision       General transfer comment: min guard for safety; cues for hand placement and technique  Ambulation/Gait Ambulation/Gait assistance: Supervision Ambulation Distance (Feet): 150 Feet Assistive device: Rolling walker (2 wheeled);1 person hand held assist Gait Pattern/deviations: Step-through pattern;Decreased stance time - left;Decreased step length - right;Decreased weight shift to left Gait velocity: reduced   General Gait Details: cues for posture, sequencing and L heel strike   Stairs            Wheelchair Mobility    Modified Rankin (Stroke Patients Only)        Balance     Sitting balance-Leahy Scale: Good       Standing balance-Leahy Scale: Fair Standing balance comment: Needs use of UEs for support with mobility.                    Cognition Arousal/Alertness: Awake/alert Behavior During Therapy: WFL for tasks assessed/performed Overall Cognitive Status: Within Functional Limits for tasks assessed                      Exercises Total Joint Exercises Quad Sets: AROM;Left;10 reps;Seated Heel Slides: AROM;Left;10 reps;Seated Hip ABduction/ADduction: AROM;Left;10 reps;Seated Straight Leg Raises: AROM;Left;5 reps;Seated Long Arc Quad: AROM;Left;10 reps;Seated Goniometric ROM: 5-65    General Comments        Pertinent Vitals/Pain Pain Assessment: Faces Pain Score: 3  Faces Pain Scale: Hurts little more Pain Location: L knee Pain Descriptors / Indicators: Aching;Sore Pain Intervention(s): Limited activity within patient's tolerance;Monitored during session;Premedicated before session;Repositioned    Home Living                      Prior Function            PT Goals (current goals can now be found in the care plan section) Acute Rehab PT Goals 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 PT Goal Formulation: With patient Time For Goal Achievement: 05/19/16 Potential to Achieve Goals: Good Progress towards PT goals: Progressing toward goals    Frequency    7X/week      PT Plan  Current plan remains appropriate    Co-evaluation             End of Session Equipment Utilized During Treatment: Gait belt Activity Tolerance: Patient tolerated treatment well Patient left: in chair;with call bell/phone within reach;with family/visitor present     Time: 1610-96041214-1240 PT Time Calculation (min) (ACUTE ONLY): 26 min  Charges:  $Gait Training: 8-22 mins $Therapeutic Exercise: 8-22 mins                    G Codes:      Derek MoundKellyn R Desarie Feild Aviendha Azbell, PTA Pager: [phone])  [phone]   05/06/2016, 3:46 PM

## 2016-05-06 NOTE — Progress Notes (Signed)
Physical Therapy Treatment Patient Details Name: Carolyn StareCeleste L Mensinger MRN: [id number] DOB: 1956/03/05 Today's Date: 05/06/2016    History of Present Illness 60 yo female with onset of OA now presenting for L TKA with immob when up.  PMHx:  sleep apnea, OA, urinary incontinence, anxiety    PT Comments    Patient continues to progress well toward mobility goals and tolerated stair training well. Current plan remains appropriate.   Follow Up Recommendations  Home health PT;Supervision for mobility/OOB     Equipment Recommendations  Rolling walker with 5" wheels    Recommendations for Other Services       Precautions / Restrictions Precautions Precautions: Knee;Fall Precaution Booklet Issued: No Required Braces or Orthoses: Knee Immobilizer - Left Knee Immobilizer - Left: On when out of bed or walking Restrictions Weight Bearing Restrictions: Yes LLE Weight Bearing: Weight bearing as tolerated Other Position/Activity Restrictions: bone foam on in bed    Mobility  Bed Mobility Overal bed mobility: Modified Independent             General bed mobility comments: increased time and effort  Transfers Overall transfer level: Needs assistance Equipment used: Rolling walker (2 wheeled);1 person hand held assist Transfers: Sit to/from Stand Sit to Stand: Supervision         General transfer comment: cues for safe use of AD and hand placement  Ambulation/Gait Ambulation/Gait assistance: Supervision Ambulation Distance (Feet): 200 Feet Assistive device: Rolling walker (2 wheeled);1 person hand held assist Gait Pattern/deviations: Step-through pattern Gait velocity: reduced   General Gait Details: pt with improved gait mechanics and less reliance on UE support   Stairs Stairs: Yes Stairs assistance: Min guard Stair Management: Two rails;Forwards;Step to pattern;No rails;With walker Number of Stairs:  (2X2; 1 step with walker to simulate getting onto deck) General  stair comments: cues for sequencing and technique  Wheelchair Mobility    Modified Rankin (Stroke Patients Only)       Balance     Sitting balance-Leahy Scale: Good       Standing balance-Leahy Scale: Fair                      Cognition Arousal/Alertness: Awake/alert Behavior During Therapy: WFL for tasks assessed/performed Overall Cognitive Status: Within Functional Limits for tasks assessed                      Exercises Total Joint Exercises Quad Sets: AROM;Left;10 reps;Seated Heel Slides: AROM;Left;10 reps;Seated Hip ABduction/ADduction: AROM;Left;10 reps;Seated Straight Leg Raises: AROM;Left;5 reps;Seated Long Arc Quad: AROM;Left;10 reps;Seated Goniometric ROM: 5-65    General Comments        Pertinent Vitals/Pain Pain Assessment: Faces Faces Pain Scale: Hurts little more Pain Location: L knee Pain Descriptors / Indicators: Sore Pain Intervention(s): Limited activity within patient's tolerance;Monitored during session;Premedicated before session;Repositioned    Home Living                      Prior Function            PT Goals (current goals can now be found in the care plan section) Acute Rehab PT Goals 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 PT Goal Formulation: With patient Time For Goal Achievement: 05/19/16 Potential to Achieve Goals: Good Progress towards PT goals: Progressing toward goals    Frequency    7X/week      PT Plan Current plan remains appropriate    Co-evaluation  End of Session Equipment Utilized During Treatment: Gait belt Activity Tolerance: Patient tolerated treatment well Patient left: with call bell/phone within reach;in bed;in CPM     Time: [id number] PT Time Calculation (min) (ACUTE ONLY): 26 min  Charges:  $Gait Training: 23-37 mins                     G Codes:      Derek MoundKellyn R Laporshia Hogen Leisha Trinkle, PTA Pager: [phone]   05/06/2016,  4:25 PM

## 2016-05-06 NOTE — Progress Notes (Signed)
Subjective: 1 Day Post-Op Procedure(s) (LRB): TOTAL KNEE ARTHROPLASTY (Left) Patient reports pain as 6 on 0-10 scale.    Objective: Vital signs in last 24 hours: Temp:  [97.7 F (36.5 C)-98.8 F (37.1 C)] 98.6 F (37 C) (11/14 0500) Pulse Rate:  [59-86] 72 (11/14 0500) Resp:  [10-20] 16 (11/14 0500) BP: (97-136)/(55-78) 119/64 (11/14 0500) SpO2:  [95 %-100 %] 97 % (11/14 0500) Weight:  [93.4 kg (206 lb)] 93.4 kg (206 lb) (11/13 0824)  Intake/Output from previous day: 11/13 0701 - 11/14 0700 In: 3050.7 [P.O.:240; I.V.:2550.7; IV Piggyback:260] Out: 2225 [Urine:2075; Blood:75] Intake/Output this shift: No intake/output data recorded.   Recent Labs  05/05/16 1509 05/06/16 0350  HGB 13.7 12.3    Recent Labs  05/05/16 1509 05/06/16 0350  WBC 9.3 16.0*  RBC 4.66 4.18  HCT 41.8 37.6  PLT 181 177    Recent Labs  05/05/16 1509 05/06/16 0350  NA  --  140  K  --  4.4  CL  --  108  CO2  --  25  BUN  --  9  CREATININE 0.87 0.90  GLUCOSE  --  185*  CALCIUM  --  8.9   No results for input(s): LABPT, INR in the last 72 hours.  ABD soft Neurovascular intact Sensation intact distally Intact pulses distally Dorsiflexion/Plantar flexion intact Incision: dressing C/D/I  Assessment/Plan: 1 Day Post-Op Procedure(s) (LRB): TOTAL KNEE ARTHROPLASTY (Left)  Principal Problem:   Primary localized osteoarthritis of left knee Active Problems:   Sleep apnea   Anxiety and depression I switched this patient to po dilaudid and iv morphine due to the itching with the oxycodone.   She did well ambulating with me this am.  She has been told that the hospital is out of stock on the Zero Knee foam block.  I know that they were ordered Thursday.  Hopefully she will get one today.  SCD were applied today.  They were at the foot of her bed since arrival but when she asked nursing about them, she was told she did not need them.  I applied them this am. Advance diet Up with therapy D/C  IV fluids Plan for discharge tomorrow  Pascal LuxSHEPPERSON,Devian Bartolomei J 05/06/2016, 8:22 AM

## 2016-05-06 NOTE — Progress Notes (Signed)
Orthopedic Tech Progress Note Patient Details:  Joanna StareCeleste L Vazquez Apr 02, 1956 [id number]  Patient ID: Joanna Vazquez, female   DOB: Apr 02, 1956, 60 y.o.   MRN: [id number] Applied cpm 0-60  Trinna PostMartinez, Zak Gondek J 05/06/2016, 6:15 AM

## 2016-05-06 NOTE — Evaluation (Signed)
Occupational Therapy Evaluation Patient Details Name: Joanna StareCeleste L Cake MRN: [id number] DOB: January 19, 1956 Today's Date: 05/06/2016    History of Present Illness 60 yo female with onset of OA now presenting for L TKA with immob when up.  PMHx:  sleep apnea, OA, urinary incontinence, anxiety   Clinical Impression   Pt currently supervision level for simulated selfcare tasks as well as toileting and shower transfers.  Pt has 3:1 but will get her husband to purchase a shower chair for the walk-in shower.  Pt plans to go upstairs to sleep and will come down during the day while her husband is at work.  No further OT needs at this time.      Follow Up Recommendations  Supervision/Assistance - 24 hour    Equipment Recommendations  Tub/shower seat    Recommendations for Other Services       Precautions / Restrictions Precautions Precautions: Knee;Fall Precaution Booklet Issued: No Precaution Comments: No order for KI but has one in the room Required Braces or Orthoses: Knee Immobilizer - Left Knee Immobilizer - Left: On when out of bed or walking Restrictions Weight Bearing Restrictions: No LLE Weight Bearing: Weight bearing as tolerated Other Position/Activity Restrictions: bone foam on in bed      Mobility Bed Mobility                  Transfers Overall transfer level: Needs assistance Equipment used: Rolling walker (2 wheeled) Transfers: Sit to/from Stand Sit to Stand: Supervision Stand pivot transfers: Supervision       General transfer comment: Min instructional cueing for hand placement with sit to stand.     Balance     Sitting balance-Leahy Scale: Good       Standing balance-Leahy Scale: Fair Standing balance comment: Needs use of UEs for support with mobility.                            ADL Overall ADL's : Needs assistance/impaired Eating/Feeding: Independent   Grooming: Wash/dry hands;Wash/dry face;Standing;Supervision/safety    Upper Body Bathing: Set up;Sitting   Lower Body Bathing: Supervison/ safety;Sit to/from stand   Upper Body Dressing : Supervision/safety;Sitting   Lower Body Dressing: Supervision/safety;Sit to/from stand   Toilet Transfer: Supervision/safety;RW;Ambulation;BSC   Toileting- ArchitectClothing Manipulation and Hygiene: Supervision/safety;Sit to/from stand   Tub/ Shower Transfer: Supervision/safety;Ambulation;Rolling walker;Anterior/posterior Tub/Shower Transfer Details (indicate cue type and reason): walk-in shower transfer Functional mobility during ADLs: Supervision/safety;Rolling walker General ADL Comments: Pt completed simulated walk-in shower transfer with supervision.  She plans to stay on the second floor but will come down daily while husband is at work to access the kitchen and living areas.  Educated on need for walker bag and shower seat.  She will get her husband to purchase shower seat from local drugstore and she already has a bag she can use on the walker.       Vision Vision Assessment?: No apparent visual deficits   Perception Perception Perception Tested?: No   Praxis Praxis Praxis tested?: Not tested    Pertinent Vitals/Pain Pain Assessment: 0-10 Pain Score: 3  Pain Location: left knee Pain Descriptors / Indicators: Discomfort Pain Intervention(s): Limited activity within patient's tolerance;Monitored during session     Hand Dominance Right   Extremity/Trunk Assessment Upper Extremity Assessment Upper Extremity Assessment: Overall WFL for tasks assessed   Lower Extremity Assessment Lower Extremity Assessment: Defer to PT evaluation   Cervical / Trunk Assessment Cervical / Trunk Assessment:  Normal   Communication Communication Communication: No difficulties   Cognition Arousal/Alertness: Awake/alert Behavior During Therapy: WFL for tasks assessed/performed Overall Cognitive Status: Within Functional Limits for tasks assessed                                 Home Living Family/patient expects to be discharged to:: Private residence Living Arrangements: Spouse/significant other Available Help at Discharge: Family;Available 24 hours/day (initally) Type of Home: House Home Access: Stairs to enter Entergy CorporationEntrance Stairs-Number of Steps: 2 Entrance Stairs-Rails: None Home Layout: Two level Alternate Level Stairs-Number of Steps: 13 Alternate Level Stairs-Rails: Right;Left;Can reach both Bathroom Shower/Tub: Walk-in shower;Door   Foot LockerBathroom Toilet: Standard     Home Equipment: Environmental consultantWalker - 2 wheels;Cane - single point;Shower seat - built in          Prior Functioning/Environment Level of Independence: Independent        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

## 2016-05-07 LAB — CBC
HEMATOCRIT: 35.1 % — AB (ref 36.0–46.0)
HEMOGLOBIN: 11.3 g/dL — AB (ref 12.0–15.0)
MCH: 28.6 pg (ref 26.0–34.0)
MCHC: 32.2 g/dL (ref 30.0–36.0)
MCV: 88.9 fL (ref 78.0–100.0)
Platelets: 176 10*3/uL (ref 150–400)
RBC: 3.95 MIL/uL (ref 3.87–5.11)
RDW: 12.9 % (ref 11.5–15.5)
WBC: 17.3 10*3/uL — AB (ref 4.0–10.5)

## 2016-05-07 LAB — BASIC METABOLIC PANEL
ANION GAP: 8 (ref 5–15)
BUN: 11 mg/dL (ref 6–20)
CHLORIDE: 106 mmol/L (ref 101–111)
CO2: 26 mmol/L (ref 22–32)
CREATININE: 0.67 mg/dL (ref 0.44–1.00)
Calcium: 9.1 mg/dL (ref 8.9–10.3)
GFR calc non Af Amer: >60 mL/min (ref >60)
Glucose, Bld: 154 mg/dL — ABNORMAL HIGH (ref 65–99)
POTASSIUM: 4.3 mmol/L (ref 3.5–5.1)
SODIUM: 140 mmol/L (ref 135–145)

## 2016-05-07 MED ORDER — DOCUSATE SODIUM 100 MG PO CAPS: ORAL_CAPSULE | ORAL | 0 refills | Status: AC

## 2016-05-07 MED ORDER — ACETAMINOPHEN 325 MG PO TABS: 650.0000 mg | ORAL_TABLET | Freq: Four times a day (QID) | ORAL | Status: AC | PRN

## 2016-05-07 MED ORDER — HYDROMORPHONE HCL 2 MG PO TABS: ORAL_TABLET | ORAL | 0 refills | Status: AC

## 2016-05-07 MED ORDER — POLYETHYLENE GLYCOL 3350 17 G PO PACK: 17.0000 g | PACK | Freq: Two times a day (BID) | ORAL | 0 refills | Status: AC

## 2016-05-07 MED ORDER — ENOXAPARIN SODIUM 30 MG/0.3ML ~~LOC~~ SOLN: 30.0000 mg | Freq: Two times a day (BID) | SUBCUTANEOUS | 0 refills | Status: AC

## 2016-05-07 NOTE — Progress Notes (Signed)
Pt ready for d/c home today per MD. Pt met PT goals, has needed equipment. Discharge instructions and prescriptions reviewed with pt, all questions answered. Belongings gathered and will be sent with pt. Assisted to car via wheelchair.   [place], Jerry Caras

## 2016-05-07 NOTE — Progress Notes (Signed)
Physical Therapy Treatment Patient Details Name: Carolyn StareCeleste L Haaland MRN: [id number] DOB: 11/30/1955 Today's Date: 05/07/2016    History of Present Illness 60 yo female with onset of OA now presenting for L TKA with immob when up.  PMHx:  sleep apnea, OA, urinary incontinence, anxiety    PT Comments    Patient is making good progress with PT.  From a mobility standpoint anticipate patient will be ready for DC home when medically ready.     Follow Up Recommendations  Home health PT;Supervision for mobility/OOB     Equipment Recommendations  Rolling walker with 5" wheels    Recommendations for Other Services       Precautions / Restrictions Precautions Precautions: Knee;Fall Precaution Booklet Issued: No Required Braces or Orthoses: Knee Immobilizer - Left Knee Immobilizer - Left: On when out of bed or walking Restrictions Weight Bearing Restrictions: Yes LLE Weight Bearing: Weight bearing as tolerated    Mobility  Bed Mobility Overal bed mobility: Modified Independent             General bed mobility comments: increased time and effort  Transfers Overall transfer level: Needs assistance Equipment used: Rolling walker (2 wheeled) Transfers: Sit to/from Stand Sit to Stand: Supervision         General transfer comment: cues for safe use of AD  Ambulation/Gait Ambulation/Gait assistance: Supervision Ambulation Distance (Feet): 250 Feet Assistive device: Rolling walker (2 wheeled);1 person hand held assist Gait Pattern/deviations: Step-through pattern     General Gait Details: pt with steady gait and ability to WB on L LE with little UE support; pt with improved L knee flexion and heel strike this session   Stairs            Wheelchair Mobility    Modified Rankin (Stroke Patients Only)       Balance     Sitting balance-Leahy Scale: Good       Standing balance-Leahy Scale: Fair                      Cognition Arousal/Alertness:  Awake/alert Behavior During Therapy: WFL for tasks assessed/performed Overall Cognitive Status: Within Functional Limits for tasks assessed                      Exercises Total Joint Exercises Quad Sets: AROM;Both;10 reps;Supine Heel Slides: AROM;Left;10 reps;Supine Hip ABduction/ADduction: AROM;Left;10 reps;Supine Straight Leg Raises: AROM;Left;10 reps;Supine Long Arc Quad: AROM;Left;10 reps;Supine Goniometric ROM: 5-70    General Comments        Pertinent Vitals/Pain Pain Assessment: Faces Faces Pain Scale: Hurts little more Pain Location: L knee Pain Descriptors / Indicators: Sore Pain Intervention(s): Limited activity within patient's tolerance;Monitored during session;Premedicated before session;Repositioned;Ice applied    Home Living                      Prior Function            PT Goals (current goals can now be found in the care plan section) Acute Rehab PT Goals 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 PT Goal Formulation: With patient Time For Goal Achievement: 05/19/16 Potential to Achieve Goals: Good Progress towards PT goals: Progressing toward goals    Frequency    7X/week      PT Plan Current plan remains appropriate    Co-evaluation             End of Session Equipment Utilized During Treatment:  Gait belt Activity Tolerance: Patient tolerated treatment well Patient left: in chair;with call bell/phone within reach;Other (comment) (L LE in zero degree foam)     Time: 3086-57840835-0909 PT Time Calculation (min) (ACUTE ONLY): 34 min  Charges:  $Gait Training: 8-22 mins $Therapeutic Exercise: 8-22 mins                    G Codes:      Derek MoundKellyn R Calee Nugent Hildagard Sobecki, PTA Pager: [phone]   05/07/2016, 9:16 AM

## 2016-05-07 NOTE — Progress Notes (Signed)
Orthopedic Tech Progress Note Patient Details:  Carolyn StareCeleste L Offutt Jul 14, 1955 [id number]  Patient ID: Carolyn Stareeleste L Garro, female   DOB: Jul 14, 1955, 60 y.o.   MRN: [id number] Applied cpm 0-90  Trinna PostMartinez, Kem Hensen J 05/07/2016, 6:24 AM

## 2016-05-07 NOTE — Care Management Note (Signed)
Case Management Note  Patient Details  Name: Joanna Vazquez MRN: [id number] Date of Birth: 04-29-1956  Subjective/Objective:   60 yr old female s/p left total knee arthroplasty.                 Action/Plan: Case manager spoke with patient concerning discharge plan. Patient was preoperatively setup with [facility], no changes. Patient states she has RW, 3in1 and CPM. Will have family support at discharge.    Expected Discharge Date:   05/07/16               Expected Discharge Plan:  Home w Home Health Services  In-House Referral:     Discharge planning Services  CM Consult  Post Acute Care Choice:  Home Health Choice offered to:  Patient  DME Arranged:  N/A DME Agency:  [employer]  HH Arranged:  PT [facility]  Status of Service:  Completed, signed off  If discussed at [employer] Length of Stay Meetings, dates discussed:    Additional Comments:  [name], [name] [name], RN 05/07/2016, 1:20 PM

## 2016-05-17 NOTE — Discharge Summary (Signed)
Patient ID: Joanna Vazquez MRN: [id number] DOB/AGE: 1956-03-17 60 y.o.  Admit date: 05/05/2016 Discharge date: 05/07/2016  Admission Diagnoses:  Principal Problem:   Primary localized osteoarthritis of left knee Active Problems:   Sleep apnea   Anxiety and depression   Discharge Diagnoses:  Same  Past Medical History:  Diagnosis Date  . Anxiety   . Anxiety and depression   . Perimenopause   . Primary localized osteoarthritis of left knee 04/23/2016  . Sleep apnea   . SUI (stress urinary incontinence, female)     Surgeries: Procedure(s): TOTAL KNEE ARTHROPLASTY on 05/05/2016   Consultants:   Discharged Condition: Improved  Hospital Course: Joanna Vazquez is an 60 y.o. female who was admitted 05/05/2016 for operative treatment ofPrimary localized osteoarthritis of left knee. Patient has severe unremitting pain that affects sleep, daily activities, and work/hobbies. After pre-op clearance the patient was taken to the operating room on 05/05/2016 and underwent  Procedure(s): TOTAL KNEE ARTHROPLASTY.    Patient was given perioperative antibiotics:  Anti-infectives    Start     Dose/Rate Route Frequency Ordered Stop   05/05/16 1800  ceFAZolin (ANCEF) IVPB 2g/100 mL premix     2 g 200 mL/hr over 30 Minutes Intravenous Every 8 hours 05/05/16 1456 05/06/16 0224   05/05/16 0815  ceFAZolin (ANCEF) 3 g in dextrose 5 % 50 mL IVPB     3 g 130 mL/hr over 30 Minutes Intravenous To Short Stay 05/05/16 0807 05/05/16 1040       Patient was given sequential compression devices, early ambulation, and chemoprophylaxis to prevent DVT.  Patient benefited maximally from hospital stay and there were no complications.    Recent vital signs: No data found.    Recent laboratory studies: No results for input(s): WBC, HGB, HCT, PLT, NA, K, CL, CO2, BUN, CREATININE, GLUCOSE, INR, CALCIUM in the last 72 hours.  Invalid input(s): PT, 2   Discharge Medications:     Medication List     STOP taking these medications   meloxicam 15 MG tablet Commonly known as:  MOBIC     TAKE these medications   acetaminophen 325 MG tablet Commonly known as:  TYLENOL Take 2 tablets (650 mg total) by mouth every 6 (six) hours as needed for mild pain (or Fever >/= 101).   CALCIUM 500 PO Take 1,000 mg by mouth daily.   clonazePAM 1 MG tablet Commonly known as:  KLONOPIN Take 1 mg by mouth at bedtime.   docusate sodium 100 MG capsule Commonly known as:  COLACE 1 tab 2 times a day while on narcotics.  STOOL SOFTENER   enoxaparin 30 MG/0.3ML injection Commonly known as:  LOVENOX Inject 0.3 mLs (30 mg total) into the skin every 12 (twelve) hours.   escitalopram 20 MG tablet Commonly known as:  LEXAPRO Take 20 mg by mouth daily.   fexofenadine 180 MG tablet Commonly known as:  ALLEGRA Take 180 mg by mouth daily.   HYDROmorphone 2 MG tablet Commonly known as:  DILAUDID 1-2 tablets every 4-6 hrs as needed for pain   polyethylene glycol packet Commonly known as:  MIRALAX / GLYCOLAX Take 17 g by mouth 2 (two) times daily.   vitamin C 250 MG tablet Commonly known as:  ASCORBIC ACID Take 250 mg by mouth daily.   Vitamin D3 1000 units Caps Take 1,000 Units by mouth daily.   zolpidem 10 MG tablet Commonly known as:  AMBIEN Take 10 mg by mouth at bedtime as needed for  sleep.       Diagnostic Studies: Mr Breast Bilateral W Wo Contrast  Result Date: 04/29/2016 CLINICAL DATA:  Strong family history of breast cancer. Personal lifetime risk of breast cancer is 29%. LABS:  Not available EXAM: BILATERAL BREAST MRI WITH AND WITHOUT CONTRAST TECHNIQUE: Multiplanar, multisequence MR images of both breasts were obtained prior to and following the intravenous administration of 20 ml of MultiHance. THREE-DIMENSIONAL MR IMAGE RENDERING ON INDEPENDENT WORKSTATION: Three-dimensional MR images were rendered by post-processing of the original MR data on an independent workstation. The  three-dimensional MR images were interpreted, and findings are reported in the following complete MRI report for this study. Three dimensional images were evaluated at the independent Dyna[employer] workstation COMPARISON:  Previous exam(s). FINDINGS: Breast composition: c. Heterogeneous fibroglandular tissue. Background parenchymal enhancement: Mild on the right and moderate on the left Right breast: No mass or abnormal enhancement. Left breast: Numerous enhancing foci identified throughout the left breast, all similar in appearance. No suspicious enhancing masses. Lymph nodes: No abnormal appearing lymph nodes. Ancillary findings:  None. IMPRESSION: No MRI evidence of malignancy. There is increased background enhancement on the left versus the right with no discrete suspicious masses identified. RECOMMENDATION: Continued annual diagnostic mammography and annual breast MRI for surveillance given family history of breast cancer and her lifetime risk of breast cancer equal to 29%. BI-RADS CATEGORY  2: Benign. Electronically Signed   By: Gerome Samavid  Williams III M.D   On: 04/29/2016 12:20    Disposition: 01-Home or Self Care  Discharge Instructions    CPM    Complete by:  As directed    Continuous passive motion machine (CPM):      Use the CPM from 0 to 90 for 6 hours per day.       You may break it up into 2 or 3 sessions per day.      Use CPM for 2 weeks or until you are told to stop.   Call MD / Call 911    Complete by:  As directed    If you experience chest pain or shortness of breath, CALL 911 and be transported to the hospital emergency room.  If you develope a fever above 101 F, pus (white drainage) or increased drainage or redness at the wound, or calf pain, call your surgeon's office.   Change dressing    Complete by:  As directed    Change the gauze dressing daily with sterile 4 x 4 inch gauze and apply TED hose.  DO NOT REMOVE BANDAGE OVER SURGICAL INCISION.  WASH WHOLE LEG INCLUDING OVER THE  WATERPROOF BANDAGE WITH SOAP AND WATER EVERY DAY.   Constipation Prevention    Complete by:  As directed    Drink plenty of fluids.  Prune juice may be helpful.  You may use a stool softener, such as Colace (over the counter) 100 mg twice a day.  Use MiraLax (over the counter) for constipation as needed.   Diet - low sodium heart healthy    Complete by:  As directed    Discharge instructions    Complete by:  As directed    INSTRUCTIONS AFTER JOINT REPLACEMENT   Remove items at home which could result in a fall. This includes throw rugs or furniture in walking pathways ICE to the affected joint every three hours while awake for 30 minutes at a time, for at least the first 3-5 days, and then as needed for pain and swelling.  Continue to use ice  for pain and swelling. You may notice swelling that will progress down to the foot and ankle.  This is normal after surgery.  Elevate your leg when you are not up walking on it.   Continue to use the breathing machine you got in the hospital (incentive spirometer) which will help keep your temperature down.  It is common for your temperature to cycle up and down following surgery, especially at night when you are not up moving around and exerting yourself.  The breathing machine keeps your lungs expanded and your temperature down.   DIET:  As you were doing prior to hospitalization, we recommend a well-balanced diet.  DRESSING / WOUND CARE / SHOWERING  Keep the surgical dressing until follow up.  The dressing is water proof, so you can shower without any extra covering.  IF THE DRESSING FALLS OFF or the wound gets wet inside, change the dressing with sterile gauze.  Please use good hand washing techniques before changing the dressing.  Do not use any lotions or creams on the incision until instructed by your surgeon.    ACTIVITY  Increase activity slowly as tolerated, but follow the weight bearing instructions below.   No driving for 6 weeks or until  further direction given by your physician.  You cannot drive while taking narcotics.  No lifting or carrying greater than 10 lbs. until further directed by your surgeon. Avoid periods of inactivity such as sitting longer than an hour when not asleep. This helps prevent blood clots.  You may return to work once you are authorized by your doctor.     WEIGHT BEARING   Weight bearing as tolerated with assist device (walker, cane, etc) as directed, use it as long as suggested by your surgeon or therapist, typically at least 2-3 weeks.   EXERCISES  Results after joint replacement surgery are often greatly improved when you follow the exercise, range of motion and muscle strengthening exercises prescribed by your doctor. Safety measures are also important to protect the joint from further injury. Any time any of these exercises cause you to have increased pain or swelling, decrease what you are doing until you are comfortable again and then slowly increase them. If you have problems or questions, call your caregiver or physical therapist for advice.   Rehabilitation is important following a joint replacement. After just a few days of immobilization, the muscles of the leg can become weakened and shrink (atrophy).  These exercises are designed to build up the tone and strength of the thigh and leg muscles and to improve motion. Often times heat used for twenty to thirty minutes before working out will loosen up your tissues and help with improving the range of motion but do not use heat for the first two weeks following surgery (sometimes heat can increase post-operative swelling).   These exercises can be done on a training (exercise) mat, on the floor, on a table or on a bed. Use whatever works the best and is most comfortable for you.    Use music or television while you are exercising so that the exercises are a pleasant break in your day. This will make your life better with the exercises acting as a  break in your routine that you can look forward to.   Perform all exercises about fifteen times, three times per day or as directed.  You should exercise both the operative leg and the other leg as well.   Exercises include:  Quad Sets -  Tighten up the muscle on the front of the thigh (Quad) and hold for 5-10 seconds.   Straight Leg Raises - With your knee straight (if you were given a brace, keep it on), lift the leg to 60 degrees, hold for 3 seconds, and slowly lower the leg.  Perform this exercise against resistance later as your leg gets stronger.  Leg Slides: Lying on your back, slowly slide your foot toward your buttocks, bending your knee up off the floor (only go as far as is comfortable). Then slowly slide your foot back down until your leg is flat on the floor again.  Angel Wings: Lying on your back spread your legs to the side as far apart as you can without causing discomfort.  Hamstring Strength:  Lying on your back, push your heel against the floor with your leg straight by tightening up the muscles of your buttocks.  Repeat, but this time bend your knee to a comfortable angle, and push your heel against the floor.  You may put a pillow under the heel to make it more comfortable if necessary.   A rehabilitation program following joint replacement surgery can speed recovery and prevent re-injury in the future due to weakened muscles. Contact your doctor or a physical therapist for more information on knee rehabilitation.    CONSTIPATION  Constipation is defined medically as fewer than three stools per week and severe constipation as less than one stool per week.  Even if you have a regular bowel pattern at home, your normal regimen is likely to be disrupted due to multiple reasons following surgery.  Combination of anesthesia, postoperative narcotics, change in appetite and fluid intake all can affect your bowels.   YOU MUST use at least one of the following options; they are listed in  order of increasing strength to get the job done.  They are all available over the counter, and you may need to use some, POSSIBLY even all of these options:    Drink plenty of fluids (prune juice may be helpful) and high fiber foods Colace 100 mg by mouth twice a day  Senokot for constipation as directed and as needed Dulcolax (bisacodyl), take with full glass of water  Miralax (polyethylene glycol) once or twice a day as needed.  If you have tried all these things and are unable to have a bowel movement in the first 3-4 days after surgery call either your surgeon or your primary doctor.    If you experience loose stools or diarrhea, hold the medications until you stool forms back up.  If your symptoms do not get better within 1 week or if they get worse, check with your doctor.  If you experience "the worst abdominal pain ever" or develop nausea or vomiting, please contact the office immediately for further recommendations for treatment.   ITCHING:  If you experience itching with your medications, try taking only a single pain pill, or even half a pain pill at a time.  You can also use Benadryl over the counter for itching or also to help with sleep.   TED HOSE STOCKINGS:  Use stockings on both legs until for at least 2 weeks or as directed by physician office. They may be removed at night for sleeping.  MEDICATIONS:  See your medication summary on the "After Visit Summary" that nursing will review with you.  You may have some home medications which will be placed on hold until you complete the course of blood thinner medication.  It is important for you to complete the blood thinner medication as prescribed.  PRECAUTIONS:  If you experience chest pain or shortness of breath - call 911 immediately for transfer to the hospital emergency department.   If you develop a fever greater that 101 F, purulent drainage from wound, increased redness or drainage from wound, foul odor from the  wound/dressing, or calf pain - CONTACT YOUR SURGEON.                                                   FOLLOW-UP APPOINTMENTS:  If you do not already have a post-op appointment, please call the office for an appointment to be seen by your surgeon.  Guidelines for how soon to be seen are listed in your "After Visit Summary", but are typically between 1-4 weeks after surgery.  OTHER INSTRUCTIONS:   Knee Replacement:  Do not place pillow under knee, focus on keeping the knee straight while resting. CPM instructions: 0-90 degrees, 2 hours in the morning, 2 hours in the afternoon, and 2 hours in the evening. Place foam block, curve side up under heel at all times except when in CPM or when walking.  DO NOT modify, tear, cut, or change the foam block in any way.  MAKE SURE YOU:  Understand these instructions.  Get help right away if you are not doing well or get worse.    Thank you for letting us be a part of your medical care team.  It is a privilege we respect greatly.  We hope these instructions will help you stay on track for a fast and full recovery!   Do not put a pillow under the knee. Place it under the heel.    Complete by:  As directed    Place gray foam block, curve side up under heel at all times except when in CPM or when walking.  DO NOT modify, tear, cut, or change in any way the gray foam block.   Increase activity slowly as tolerated    Complete by:  As directed    Patient may shower    Complete by:  As directed    Aquacel dressing is water proof    Wash over it and the whole leg with soap and water at the end of your shower   TED hose    Complete by:  As directed    Use stockings (TED hose) for 2 weeks on both leg(s).  You may remove them at night for sleeping.      Follow-up Information    Nilda Simmer, MD Follow up on 05/19/2016.   Specialty:  Orthopedic Surgery Why:  APPT TIME 2 PM Contact information: [address] [place]  [address] [phone]        [facility] Follow up.   Specialty:  Home Health Services Why:  Someone from [facility] will contact you to arrange start date and time for therapy. Contact information: [address] [place] [address] [phone]            Signed: Pascal Lux 05/17/2016, 1:12 PM

## 2017-04-03 ENCOUNTER — Other Ambulatory Visit: Payer: Self-pay | Admitting: Obstetrics and Gynecology

## 2017-04-03 DIAGNOSIS — Z853 Personal history of malignant neoplasm of breast: Secondary | ICD-10-CM

## 2017-04-03 DIAGNOSIS — R921 Mammographic calcification found on diagnostic imaging of breast: Secondary | ICD-10-CM

## 2017-08-17 ENCOUNTER — Ambulatory Visit
Admission: RE | Admit: 2017-08-17 | Discharge: 2017-08-17 | Disposition: A | Discharge status: Home / Self Care | Payer: Self-pay | Source: Ambulatory Visit | Attending: Obstetrics and Gynecology | Admitting: Obstetrics and Gynecology

## 2017-08-17 ENCOUNTER — Encounter: Payer: Self-pay | Admitting: Radiology

## 2017-08-17 DIAGNOSIS — R921 Mammographic calcification found on diagnostic imaging of breast: Secondary | ICD-10-CM

## 2019-02-17 ENCOUNTER — Other Ambulatory Visit: Payer: Self-pay

## 2019-02-22 ENCOUNTER — Other Ambulatory Visit: Payer: Self-pay

## 2019-02-22 ENCOUNTER — Ambulatory Visit: Payer: Self-pay | Admitting: Internal Medicine

## 2019-02-22 VITALS — BP 128/86 | HR 71 | Temp 98.3°F | Ht 64.0 in | Wt 219.6 lb

## 2019-02-22 DIAGNOSIS — R7303 Prediabetes: Secondary | ICD-10-CM | POA: Insufficient documentation

## 2019-02-22 MED ORDER — METFORMIN HCL 1000 MG PO TABS: 1000.0000 mg | ORAL_TABLET | Freq: Every day | ORAL | 3 refills | Status: AC

## 2019-02-22 NOTE — Patient Instructions (Signed)
-   Start Metformin Half a tablet with Breakfast for 1 week, if no nausea or diarrhea, increase to a full tablet with Breakfast    - Try and limit daily calorie intake to 1200-1500 daily   - Try and exercise with brisk walking ( or swimming ) between 150-170 minutes per week

## 2019-02-22 NOTE — Progress Notes (Signed)
Name: Joanna Vazquez  MRN/ DOB: [id number], 04-10-1956   Age/ Sex: 63 y.o., female    PCP: Kandyce Rud, MD   Reason for Endocrinology Evaluation: Pre-Diabetes     Date of Initial Endocrinology Visit: 02/23/2019     PATIENT IDENTIFIER: Joanna Vazquez is a 63 y.o. female with a past medical history of OSA, RLS and prediabetes. The patient presented for initial endocrinology clinic visit on 02/23/2019 for consultative assistance with her diabetes management.    HPI: Ms. Levatino has been diagnosed with pre-diabetes for years. Her A1c in 2019 was 6.2%. She was advised to lose weight but admits to non-compliance with lifestyle changes. Repeat A1c in 2020 is 6.4%. 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   Otherwise she denies any pain, polyuria or polydipsia   She has a 43 yr old son, she did not have any complications during that pregnancy.    In terms of diet, the patient eats 2 meals day , snacks at night, the pt avoids sugar -sweetened beverages.   HOME  REGIMEN: N/A    PAST HISTORY: Past Medical History:  Past Medical History:  Diagnosis Date  . Anxiety   . Anxiety and depression   . Perimenopause   . Primary localized osteoarthritis of left knee 04/23/2016  . Sleep apnea   . SUI (stress urinary incontinence, female)    Past Surgical History:  Past Surgical History:  Procedure Laterality Date  . ANAL EXAMINATION UNDER ANESTHESIA  03/07/13   banding of internal and external hems  . APPENDECTOMY    . CARPAL TUNNEL RELEASE Bilateral LEFT  01-15-2010/   RIGHT 05-31-2010  . CYSTOSCOPY N/A 01/17/2013   Procedure: CYSTOSCOPY;  Surgeon: Juluis Mire, MD;  Location: [facility];  Service: Gynecology;  Laterality: N/A;  . DIAGNOSTIC LAPAROSCOPY  1988  . EXCISION NEVUS RIGHT MID BACK/ EXCISION MELANOCYSTIC NEOPLASM RIGHT LOWER LEG   11-08-1999  . MENISCUS REPAIR     LEFT  . PUBOVAGINAL SLING N/A 01/17/2013   Procedure: Leonides Grills;  Surgeon: Juluis Mire, MD;  Location: [facility];  Service: Gynecology;  Laterality: N/A;  transobturator sling with cysto  . TOTAL KNEE ARTHROPLASTY Left 05/05/2016   Procedure: TOTAL KNEE ARTHROPLASTY;  Surgeon: Salvatore Marvel, MD;  Location: [facility] OR;  Service: Orthopedics;  Laterality: Left;      Social History:  reports that she has quit smoking. She quit smokeless tobacco use about 28 years ago. She reports current alcohol use of about 2.0 standard drinks of alcohol per week. She reports that she does not use drugs. Family History:  Family History  Problem Relation Age of Onset  . Diabetes Father   . Alzheimer's disease Father   . Colon cancer Father   . Breast cancer Sister 39     HOME MEDICATIONS: Allergies as of 02/22/2019   No Known Allergies     Medication List       Accurate as of February 22, 2019 11:59 PM. If you have any questions, ask your nurse or doctor.        acetaminophen 325 MG tablet Commonly known as: TYLENOL Take 2 tablets (650 mg total) by mouth every 6 (six) hours as needed for mild pain (or Fever >/= 101).   AMINO ACID PO Take by mouth 2 (two) times daily.  CALCIUM 500 PO Take 1,000 mg by mouth daily.   clonazePAM 1 MG tablet Commonly known as: KLONOPIN Take 1 mg by mouth at bedtime.   docusate sodium 100 MG capsule Commonly known as: COLACE 1 tab 2 times a day while on narcotics.  STOOL SOFTENER   enoxaparin 30 MG/0.3ML injection Commonly known as: LOVENOX Inject 0.3 mLs (30 mg total) into the skin every 12 (twelve) hours.   escitalopram 20 MG tablet Commonly known as: LEXAPRO Take 20 mg by mouth daily.   fexofenadine 180 MG tablet Commonly known as: ALLEGRA Take 180 mg by mouth daily.   Fish Oil 600 MG Caps Take by mouth.   HYDROmorphone 2 MG tablet Commonly known as: DILAUDID 1-2 tablets every 4-6 hrs  as needed for pain   magnesium 30 MG tablet Take 2,000 mg by mouth 2 (two) times daily.   metFORMIN 1000 MG tablet Commonly known as: GLUCOPHAGE Take 1 tablet (1,000 mg total) by mouth daily with breakfast. Started by: Scarlette ShortsIbtehal J Shamleffer, MD   multivitamin tablet Take 1 tablet by mouth 2 (two) times daily.   polyethylene glycol 17 g packet Commonly known as: MIRALAX / GLYCOLAX Take 17 g by mouth 2 (two) times daily.   QC Tumeric Complex 500 MG Caps Generic drug: Turmeric Take by mouth.   vitamin C 250 MG tablet Commonly known as: ASCORBIC ACID Take 250 mg by mouth daily.   Vitamin D3 25 MCG (1000 UT) Caps Take 1,000 Units by mouth daily.   zolpidem 10 MG tablet Commonly known as: AMBIEN Take 10 mg by mouth at bedtime as needed for sleep.        ALLERGIES: No Known Allergies   REVIEW OF SYSTEMS: A comprehensive ROS was conducted with the patient and is negative except as per HPI and below:  Review of Systems  Constitutional: Negative for chills and fever.  HENT: Negative for congestion and sore throat.   Eyes: Negative for blurred vision and pain.  Cardiovascular: Negative for chest pain and palpitations.  Gastrointestinal: Negative for diarrhea and nausea.  Genitourinary: Negative for frequency.  Skin: Negative.   Neurological: Negative for tingling and tremors.  Endo/Heme/Allergies: Negative for polydipsia.      OBJECTIVE:   VITAL SIGNS: BP 128/86 (BP Location: Left Arm, Patient Position: Sitting, Cuff Size: Normal)   Pulse 71   Temp 98.3 F (36.8 C)   Ht 5\' 4"  (1.626 m)   Wt 219 lb 9.6 oz (99.6 kg)   LMP 12/12/2012   SpO2 98%   BMI 37.69 kg/m    PHYSICAL EXAM:  General: Pt appears well and is in NAD  Hydration: Well-hydrated with moist mucous membranes and good skin turgor  HEENT: Head: Unremarkable with good dentition. Oropharynx clear without exudate.  Eyes: External eye exam normal without stare, lid lag or exophthalmos.  EOM intact.    Neck: General: Supple without adenopathy or carotid bruits. Thyroid: Thyroid size normal.  No goiter or nodules appreciated. No thyroid bruit.  Lungs: Clear with good BS bilat with no rales, rhonchi, or wheezes  Heart: RRR with normal S1 and S2 and no gallops; no murmurs; no rub  Abdomen: Normoactive bowel sounds, soft, nontender, without masses or organomegaly palpable  Extremities:  Lower extremities - No pretibial edema. No lesions.  Skin: Normal texture and temperature to palpation. No rash noted. No Acanthosis nigricans/skin tags. No lipohypertrophy.  Neuro: MS is good with appropriate affect, pt is alert and Ox3     DATA REVIEWED:  02/01/2019 Total Cholesterol 222 mg/dL  TG 185 HDL 50 LDL 135 mg/dL A1c 6.4%  Hg 12.3     ASSESSMENT / PLAN / RECOMMENDATIONS:   1) Pre-Diabetes :   - Discussed the importance of lifestyle changes with diet and exercise in preventing diabetes. We did discuss that the literature have shown that lifestyle changes have better impact in reducing the risk of diabetes then Metformin alone, but I do recommend starting Metformin in addition to life style changes . We discussed GI side effects of nausea and diarrhea. Pt agreed to start metformin, she is advised to exercise 150-170 minutes/week - She was also advised to download the "myfitness pal" app and limit calories 1200-1500 kCal/day    MEDICATIONS:  Metformin 1000 mg, half a tablet with Breakfast for 1 week, if no GI side effects, she may increase to a full tablet      F/U in 3 months      Signed electronically by: Mack Guise, MD  [facility] Endocrinology  [facility] [address] [place], [place] [address] Phone: [phone] FAX: [phone]   CC: Derinda Late, MD [address] [facility] [place] [address] Phone: [phone]  Fax: [phone]    Return to Endocrinology clinic as below:  Future Appointments  Date Time Provider [place]  03/08/2019 10:00 AM Stefanie Libel, MD [facility]  05/24/2019  9:30 AM Shamleffer, Melanie Crazier, MD [facility] None

## 2019-02-23 ENCOUNTER — Encounter: Payer: Self-pay | Admitting: Internal Medicine

## 2019-03-08 ENCOUNTER — Ambulatory Visit (INDEPENDENT_AMBULATORY_CARE_PROVIDER_SITE_OTHER): Payer: Self-pay | Admitting: Sports Medicine

## 2019-03-08 ENCOUNTER — Encounter: Payer: Self-pay | Admitting: Sports Medicine

## 2019-03-08 ENCOUNTER — Other Ambulatory Visit: Payer: Self-pay

## 2019-03-08 ENCOUNTER — Encounter

## 2019-03-08 VITALS — BP 126/80 | Ht 65.0 in | Wt 200.0 lb

## 2019-03-08 DIAGNOSIS — M79671 Pain in right foot: Secondary | ICD-10-CM

## 2019-03-08 DIAGNOSIS — M25579 Pain in unspecified ankle and joints of unspecified foot: Secondary | ICD-10-CM | POA: Insufficient documentation

## 2019-03-08 DIAGNOSIS — M1711 Unilateral primary osteoarthritis, right knee: Secondary | ICD-10-CM

## 2019-03-08 DIAGNOSIS — M25571 Pain in right ankle and joints of right foot: Secondary | ICD-10-CM

## 2019-03-08 NOTE — Patient Instructions (Signed)
Your foot pain will be improved by a custom orthotic - We will have you follow-up within the next several weeks to have a custom orthotic made.  Make sure you bring a lace up tennis shoe that you would like the custom orthotics to fit to this next visit.  Your knee pain is also worsened by your foot pain - Continue to use the stationary bike as this will help your knee pain - You may wear the knee compression sleeve fitted for you at today's visit as this may also improve your knee pain  We will see you back within the next several weeks to have custom orthotics made

## 2019-03-08 NOTE — Progress Notes (Signed)
PCP: Kandyce RudBabaoff, Marcus, MD  Subjective:   HPI: Patient is a 63 y.o. female here for evaluation of right foot pain.  Patient's pain is been present for the last 6 months.  It is located on the medial aspect of her midfoot.  Does not radiate.  She denies any injury or trauma.  She does endorse a history of osteoarthritis of right knee for which she sees Dr. Thurston HoleWainer.  She notes the foot pain is causing her to have a limp which aggravates her right knee pain.  She denies any bruising or swelling.  She was recently seen by Dr. Thurston HoleWainer who noted some changes to her right foot and she was sent here for further evaluation and possible custom orthotics.  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  Past Medical History:  Diagnosis Date  . Anxiety   . Anxiety and depression   . Perimenopause   . Primary localized osteoarthritis of left knee 04/23/2016  . Sleep apnea   . SUI (stress urinary incontinence, female)     Current Outpatient Medications on File Prior to Visit  Medication Sig Dispense Refill  . acetaminophen (TYLENOL) 325 MG tablet Take 2 tablets (650 mg total) by mouth every 6 (six) hours as needed for mild pain (or Fever >/= 101).    . Amino Acids (AMINO ACID PO) Take by mouth 2 (two) times daily.    . Calcium-Magnesium-Vitamin D (CALCIUM 500 PO) Take 1,000 mg by mouth daily.    . Cholecalciferol (VITAMIN D3) 1000 units CAPS Take 1,000 Units by mouth daily.    . clonazePAM (KLONOPIN) 1 MG tablet Take 1 mg by mouth at bedtime.    . docusate sodium (COLACE) 100 MG capsule 1 tab 2 times a day while on narcotics.  STOOL SOFTENER (Patient not taking: Reported on 02/22/2019) 60 capsule 0  . enoxaparin (LOVENOX) 30 MG/0.3ML injection Inject 0.3 mLs (30 mg total) into the skin every 12 (twelve) hours. (Patient not taking: Reported on 02/22/2019) 30 Syringe 0  . escitalopram (LEXAPRO) 20 MG tablet Take 20 mg by mouth daily.    . fexofenadine (ALLEGRA) 180 MG tablet Take 180 mg by mouth daily.     Marland Kitchen. HYDROmorphone (DILAUDID) 2 MG tablet 1-2 tablets every 4-6 hrs as needed for pain (Patient not taking: Reported on 02/22/2019) 84 tablet 0  . magnesium 30 MG tablet Take 2,000 mg by mouth 2 (two) times daily.    . metFORMIN (GLUCOPHAGE) 1000 MG tablet Take 1 tablet (1,000 mg total) by mouth daily with breakfast. 90 tablet 3  . Multiple Vitamin (MULTIVITAMIN) tablet Take 1 tablet by mouth 2 (two) times daily.    . Omega-3 Fatty Acids (FISH OIL) 600 MG CAPS Take by mouth.    . polyethylene glycol (MIRALAX / GLYCOLAX) packet Take 17 g by mouth 2 (two) times daily. (Patient not taking: Reported on 02/22/2019) 14 each 0  . Turmeric (QC TUMERIC COMPLEX) 500 MG CAPS Take by mouth.    . vitamin C (ASCORBIC ACID) 250 MG tablet Take 250 mg by mouth daily.    Marland Kitchen. zolpidem (AMBIEN) 10 MG tablet Take 10 mg by mouth at bedtime as needed for sleep.      No current facility-administered medications on file prior to visit.     Past Surgical History:  Procedure Laterality Date  . ANAL EXAMINATION UNDER ANESTHESIA  03/07/13   banding of internal and external hems  . APPENDECTOMY    . CARPAL TUNNEL RELEASE  Bilateral LEFT  01-15-2010/   RIGHT 05-31-2010  . CYSTOSCOPY N/A 01/17/2013   Procedure: CYSTOSCOPY;  Surgeon: Darlyn Chamber, MD;  Location: [facility];  Service: Gynecology;  Laterality: N/A;  . DIAGNOSTIC LAPAROSCOPY  1988  . 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 MENISCUS REPAIR     LEFT  . PUBOVAGINAL SLING N/A 01/17/2013   Procedure: Gaynelle Arabian;  Surgeon: Darlyn Chamber, MD;  Location: [facility];  Service: Gynecology;  Laterality: N/A;  transobturator sling with cysto  . TOTAL KNEE ARTHROPLASTY Left 05/05/2016   Procedure: TOTAL KNEE ARTHROPLASTY;  Surgeon: Elsie Saas, MD;  Location: [facility];  Service: Orthopedics;  Laterality: Left;    No Known Allergies  Social History   Socioeconomic History  . Marital  status: Married    Spouse name: Not on file  . Number of children: Not on file  . Years of education: Not on file  . Highest education level: Not on file  Occupational History  . Not on file  Social Needs  . Financial resource strain: Not on file  . Food insecurity    Worry: Not on file    Inability: Not on file  . Transportation needs    Medical: Not on file    Non-medical: Not on file  Tobacco Use  . Smoking status: Former Research scientist (life sciences)  . Smokeless tobacco: Former Systems developer    Quit date: 01/29/1991  Substance and Sexual Activity  . Alcohol use: Yes    Alcohol/week: 2.0 standard drinks    Types: 2 Standard drinks or equivalent per week    Comment: OCC  . Drug use: No  . Sexual activity: Not on file  Lifestyle  . Physical activity    Days per week: Not on file    Minutes per session: Not on file  . Stress: Not on file  Relationships  . Social Herbalist on phone: Not on file    Gets together: Not on file    Attends religious service: Not on file    Active member of club or organization: Not on file    Attends meetings of clubs or organizations: Not on file    Relationship status: Not on file  . Intimate partner violence    Fear of current or ex partner: Not on file    Emotionally abused: Not on file    Physically abused: Not on file    Forced sexual activity: Not on file  Other Topics Concern  . Not on file  Social History Narrative  . Not on file    Family History  Problem Relation Age of Onset  . Diabetes Father   . Alzheimer's disease Father   . Colon cancer Father   . Breast cancer Sister 47        Objective:  Physical Exam: BP 126/80   Ht 5\' 5"  (1.651 m)   Wt 200 lb (90.7 kg)   LMP 12/12/2012   BMI 33.28 kg/m  Gen: NAD, comfortable in exam room Lungs: Breathing comfortably on room air Ankle/Foot Exam Right -Inspection: Preservation of the longitudinal arch with collapse of the transverse arch.  Patient has splaying between toes 2 and 3.  She  also has pisogenic papules along the medial aspect of her right foot.  Patient with a subluxed fifth MTP but no true bunionette  While long arch is preserved there is about 5 deg. Of pronation on  RT and not left; splaying seems more breakdown of medial column of foot than of MT arch -Palpation: Medial malleolus: non-tender; lateral malleolus: non-tender; 5th metatarsal: non-tender; calcaneus: non-tender; plantar fascia insertion: non-tender -ROM: Dorsiflexion: 20 degrees; plantarflexion: 50 degrees; Inversion: 35 degrees; Eversion: 25 degrees -Strength: Dorsiflexion: 5/5; Plantarflexion: 5/5; Inversion: 5/5; Eversion: 5/5 -Special Tests: Anterior drawer: negative; Talar tilt: Negative; Calcaneal squeeze: Negative; Tib/fib: Negative -Limb neurovascularly intact  Contralateral Ankle -Inspection: Collapse of the transverse arch.  Normal appearance of longitudinal arch -Palpation: Medial malleolus: non-tender; lateral malleolus: non-tender; 5th metatarsal: non-tender; calcaneus: non-tender; plantar fascia insertion: non-tender -ROM: Dorsiflexion: 20 degrees; plantarflexion: 50 degrees; Inversion: 35 degrees; Eversion: 25 degrees -Strength: Dorsiflexion: 5/5; Plantarflexion: 5/5; Inversion: 5/5; Eversion: 5/5 -Limb neurovascularly intact  -Gait: Flat-footed gait with eversion of the right foot    Assessment & Plan:  Patient is a 63 y.o. female here for evaluation of right foot pain  1.  Right foot pain - Patient with gait abnormality and right foot deformity as described in physical exam. -Patient's right foot pain likely contributing to her right knee pain - Patient instructed to get a pair of comfortable fitting tennis shoes with laces.  She will return in the next several weeks to have custom orthotics made for correction of the abnormalities described above  2.  Right knee osteoarthritis -Patient given body helix compression sleeve for her right knee to see if this helps improve her  pain  Follow-up next several weeks for custom orthotics  I observed and examined the patient with Dr. Lyn Hollingshead and agree with assessment and plan.  Note reviewed and modified by me. Sterling Big, MD

## 2019-03-08 NOTE — Assessment & Plan Note (Signed)
Trial with body helix for support and comfort  Custom orthotic may help pain

## 2019-03-08 NOTE — Assessment & Plan Note (Signed)
Plan for custom orthotics

## 2019-05-24 ENCOUNTER — Ambulatory Visit: Payer: Self-pay | Admitting: Internal Medicine

## 2019-06-29 ENCOUNTER — Ambulatory Visit: Payer: Self-pay | Admitting: Internal Medicine

## 2020-02-09 ENCOUNTER — Other Ambulatory Visit: Payer: Self-pay | Admitting: Internal Medicine

## 2021-05-29 ENCOUNTER — Other Ambulatory Visit: Payer: Self-pay | Admitting: Obstetrics and Gynecology

## 2021-05-29 DIAGNOSIS — R928 Other abnormal and inconclusive findings on diagnostic imaging of breast: Secondary | ICD-10-CM

## 2021-07-29 ENCOUNTER — Ambulatory Visit
Admission: RE | Admit: 2021-07-29 | Discharge: 2021-07-29 | Disposition: A | Discharge status: Home / Self Care | Payer: No Typology Code available for payment source | Source: Ambulatory Visit | Attending: Obstetrics and Gynecology | Admitting: Obstetrics and Gynecology

## 2021-07-29 ENCOUNTER — Other Ambulatory Visit: Payer: Self-pay | Admitting: Obstetrics and Gynecology

## 2021-07-29 DIAGNOSIS — R928 Other abnormal and inconclusive findings on diagnostic imaging of breast: Secondary | ICD-10-CM

## 2021-07-29 DIAGNOSIS — R921 Mammographic calcification found on diagnostic imaging of breast: Secondary | ICD-10-CM

## 2021-08-08 ENCOUNTER — Ambulatory Visit
Admission: RE | Admit: 2021-08-08 | Discharge: 2021-08-08 | Disposition: A | Discharge status: Home / Self Care | Payer: No Typology Code available for payment source | Source: Ambulatory Visit | Attending: Obstetrics and Gynecology | Admitting: Obstetrics and Gynecology

## 2021-08-08 DIAGNOSIS — R921 Mammographic calcification found on diagnostic imaging of breast: Secondary | ICD-10-CM

## 2022-04-15 ENCOUNTER — Other Ambulatory Visit: Payer: Self-pay | Admitting: Obstetrics and Gynecology

## 2022-04-15 DIAGNOSIS — Z139 Encounter for screening, unspecified: Secondary | ICD-10-CM

## 2022-06-30 ENCOUNTER — Ambulatory Visit
Admission: RE | Admit: 2022-06-30 | Discharge: 2022-06-30 | Disposition: A | Discharge status: Home / Self Care | Payer: Medicare Other | Source: Ambulatory Visit | Attending: Obstetrics and Gynecology | Admitting: Obstetrics and Gynecology

## 2022-06-30 DIAGNOSIS — Z139 Encounter for screening, unspecified: Secondary | ICD-10-CM

## 2023-05-05 ENCOUNTER — Other Ambulatory Visit: Payer: Self-pay | Admitting: Obstetrics and Gynecology

## 2023-05-05 DIAGNOSIS — Z1231 Encounter for screening mammogram for malignant neoplasm of breast: Secondary | ICD-10-CM

## 2023-07-14 ENCOUNTER — Ambulatory Visit: Payer: Medicare Other

## 2023-08-11 ENCOUNTER — Ambulatory Visit: Payer: Medicare Other

## 2023-08-25 ENCOUNTER — Ambulatory Visit
Admission: RE | Admit: 2023-08-25 | Discharge: 2023-08-25 | Disposition: A | Discharge status: Home / Self Care | Payer: Medicare Other | Source: Ambulatory Visit | Attending: Obstetrics and Gynecology | Admitting: Obstetrics and Gynecology

## 2023-08-25 DIAGNOSIS — Z1231 Encounter for screening mammogram for malignant neoplasm of breast: Secondary | ICD-10-CM

## 2023-09-23 IMAGING — MG MM BREAST BX W/ LOC DEV 1ST LESION IMAGE BX SPEC STEREO GUIDE*R*
8 of 12 series · 8 of 24 positions shown · non-contrast
Comparison: Previous exams.
COMPARISON: Previous exams.

Addendum:
CLINICAL DATA: Patient presents for stereotactic core needle biopsy
of right breast calcifications.

EXAM:
RIGHT BREAST STEREOTACTIC CORE NEEDLE BIOPSY

[R (1 of 8)]
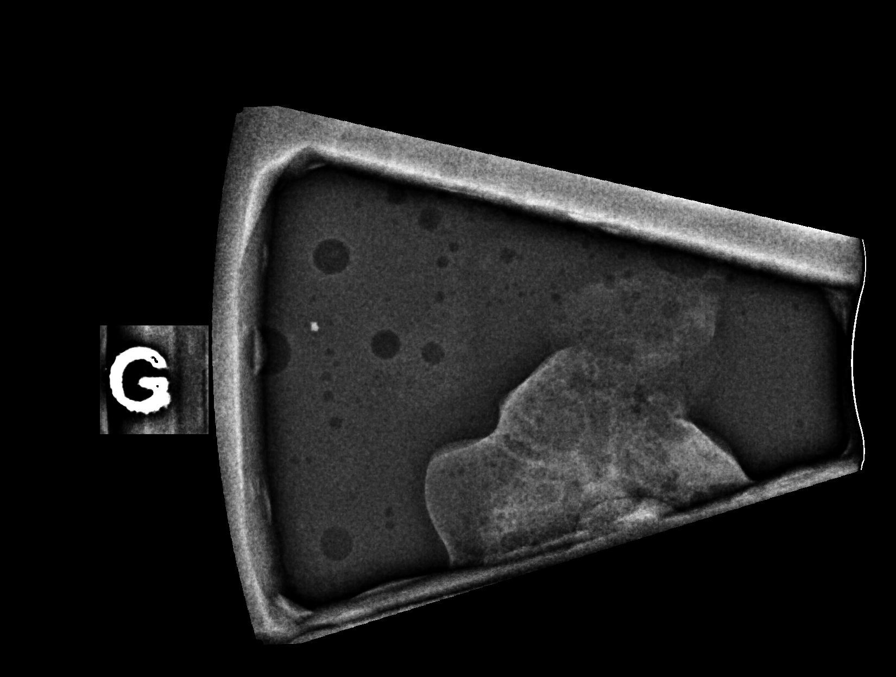

[R (2 of 8)]
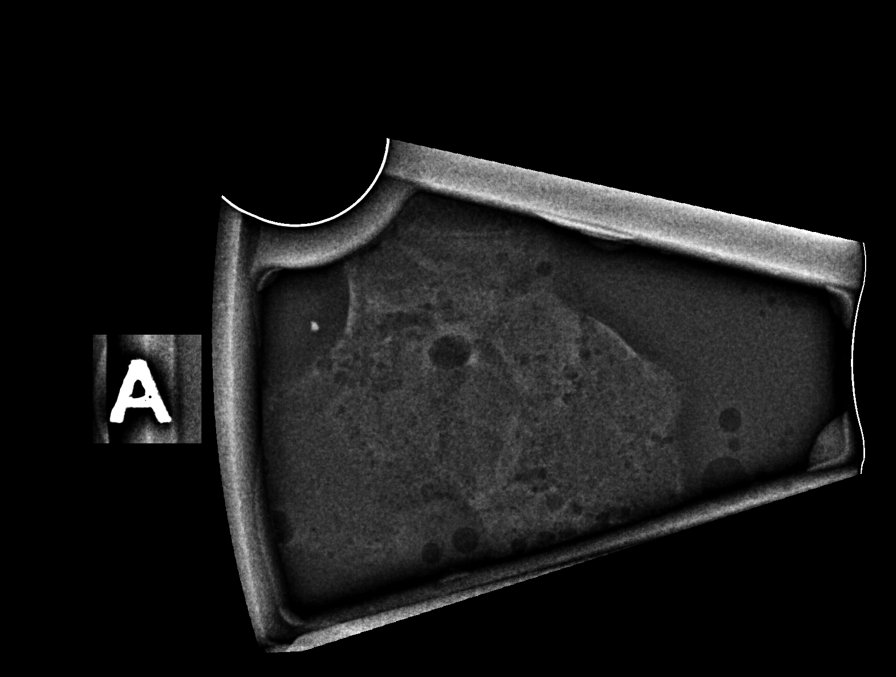

[R (3 of 8)]
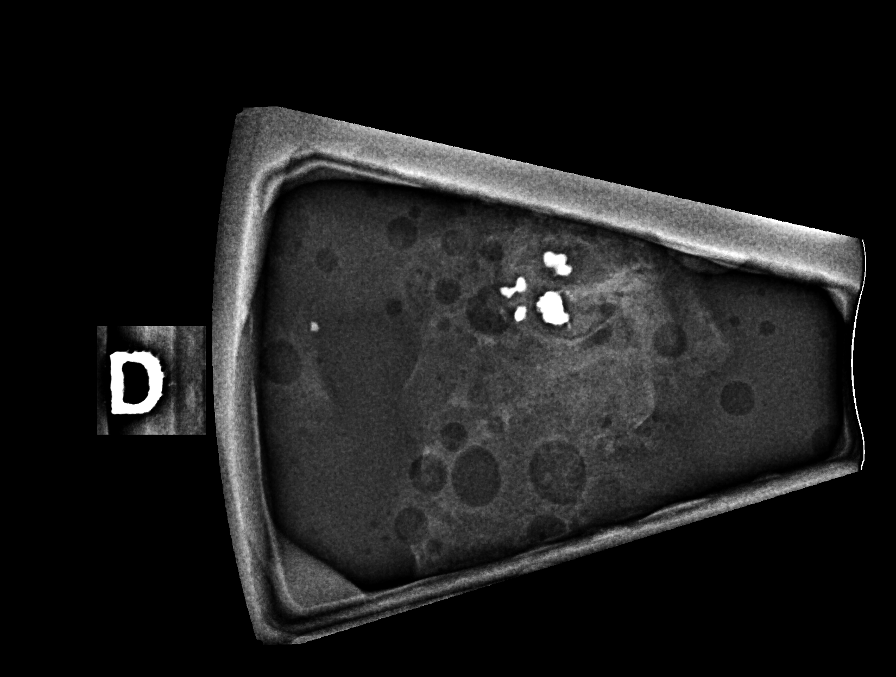

[R (4 of 8)]
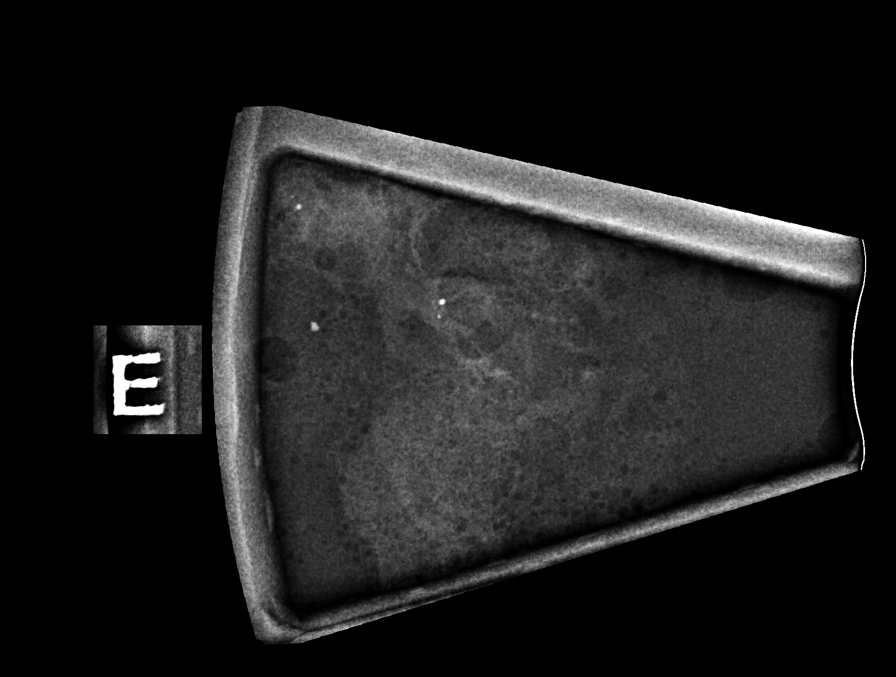

[R (5 of 8)]
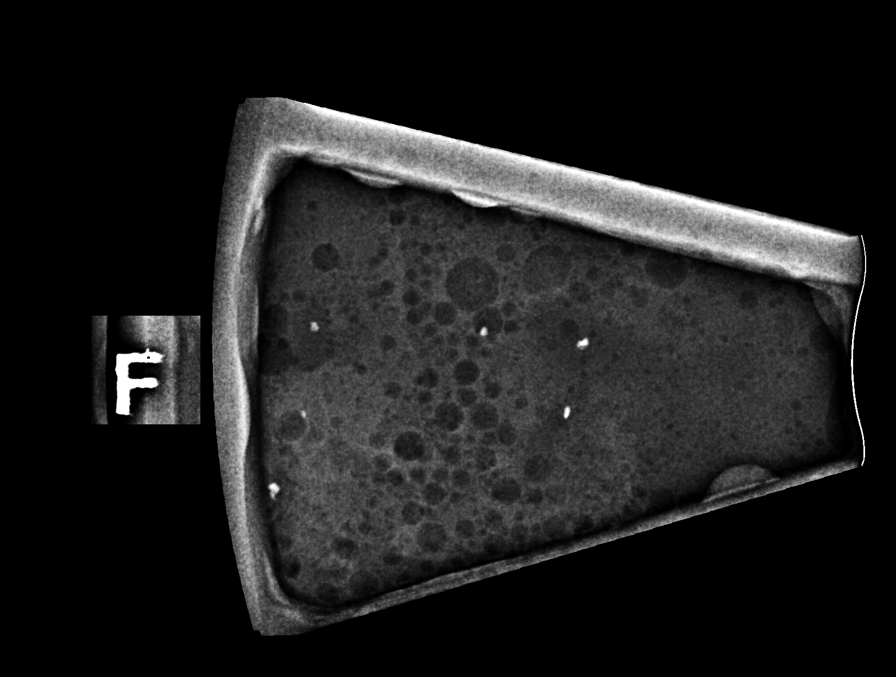

[R (6 of 8)]
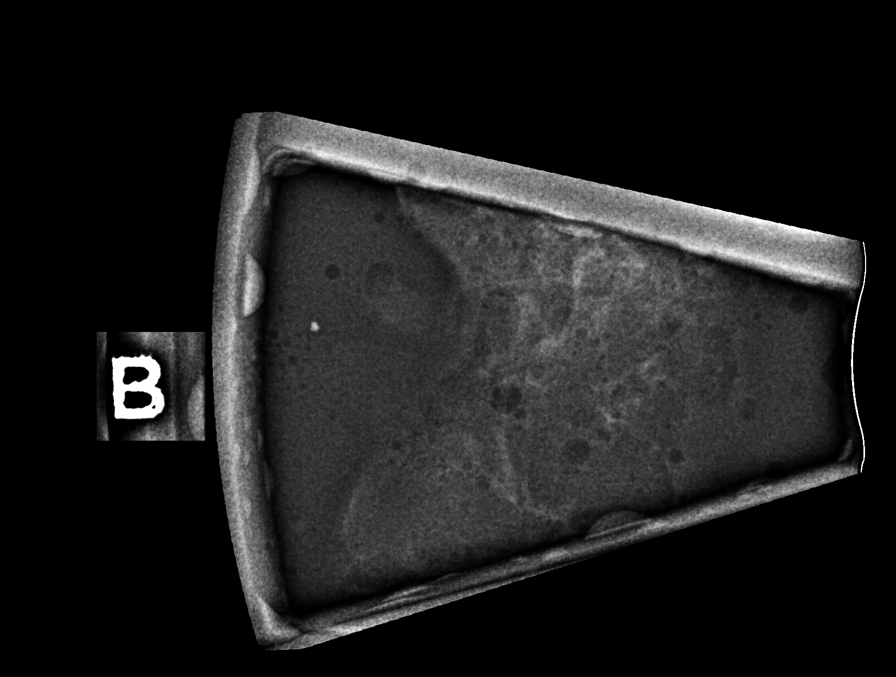

[R (7 of 8)]
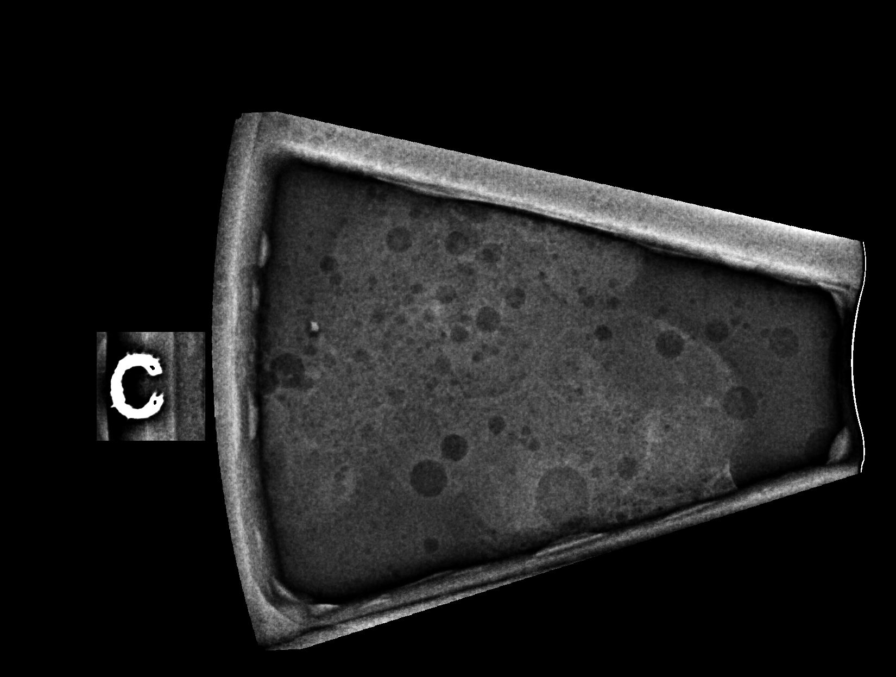

[R (8 of 8)]
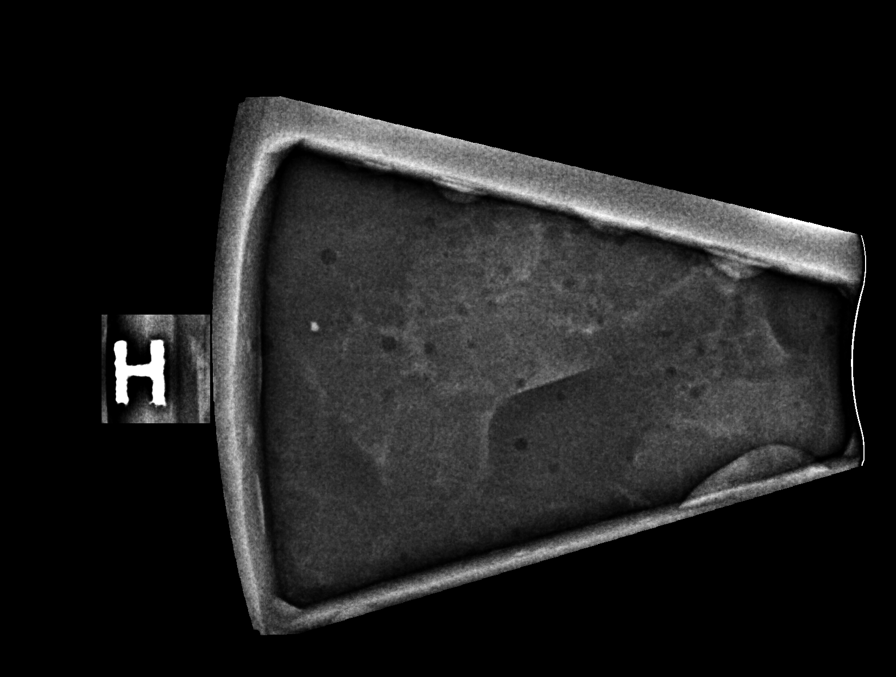

[8 of 24 positions shown; findings below may reference images not displayed]



Using sterile technique and 1% Lidocaine as local anesthetic, under
stereotactic guidance, a 9 gauge vacuum assisted device was used to
perform core needle biopsy of calcifications in the lower inner
quadrant of the right breast using a medial approach. Specimen
radiograph was performed showing calcifications for which biopsy was
performed. Specimens with calcifications are identified for
pathology.

Lesion quadrant: Lower inner quadrant

At the conclusion of the procedure, a coil shaped tissue marker clip
was deployed into the biopsy cavity. Follow-up 2-view mammogram was
performed and dictated separately.
IMPRESSION: Stereotactic-guided biopsy of right breast calcifications. No
apparent complications.

ADDENDUM:
Pathology revealed COLUMNAR CELL AND FIBROCYSTIC CHANGES WITH
APOCRINE METAPLASIA, SCLEROTIC FIBROADENOMATOID NODULE WITH
DYSTROPHIC CALCIFICATIONS of the RIGHT breast, lower, inner
quadrant, (coil clip). This was found to be concordant by Dr. Reichardt
Deer.

Pathology results were discussed with the patient by telephone by
Arrielle Aj, RN Nurse Navigator. The patient reported doing well
after the biopsy with tenderness at the site. Post biopsy
instructions and care were reviewed and questions were answered. The
patient was encouraged to call [REDACTED] for any additional concerns.

The patient was instructed to return for annual screening

Pathology results reported by Helly Gillis, RN on 08/09/2021.



Using sterile technique and 1% Lidocaine as local anesthetic, under
stereotactic guidance, a 9 gauge vacuum assisted device was used to
perform core needle biopsy of calcifications in the lower inner
quadrant of the right breast using a medial approach. Specimen
radiograph was performed showing calcifications for which biopsy was
performed. Specimens with calcifications are identified for
pathology.

Lesion quadrant: Lower inner quadrant

At the conclusion of the procedure, a coil shaped tissue marker clip
was deployed into the biopsy cavity. Follow-up 2-view mammogram was
performed and dictated separately.
IMPRESSION: Stereotactic-guided biopsy of right breast calcifications. No
apparent complications.
